# Patient Record
Sex: Male | Born: 1955 | Race: Black or African American | Hispanic: No | Smoking: Current some day smoker
Health system: Southern US, Community
[De-identification: ages and names within clinical notes are randomized; demographics above are authoritative.]

## PROBLEM LIST (undated history)

## (undated) DIAGNOSIS — I10 Essential (primary) hypertension: Secondary | ICD-10-CM

## (undated) DIAGNOSIS — F32A Depression, unspecified: Secondary | ICD-10-CM

## (undated) DIAGNOSIS — J449 Chronic obstructive pulmonary disease, unspecified: Secondary | ICD-10-CM

## (undated) DIAGNOSIS — C801 Malignant (primary) neoplasm, unspecified: Secondary | ICD-10-CM

## (undated) DIAGNOSIS — F419 Anxiety disorder, unspecified: Secondary | ICD-10-CM

## (undated) DIAGNOSIS — F0390 Unspecified dementia without behavioral disturbance: Secondary | ICD-10-CM

## (undated) DIAGNOSIS — E119 Type 2 diabetes mellitus without complications: Secondary | ICD-10-CM

## (undated) DIAGNOSIS — F329 Major depressive disorder, single episode, unspecified: Secondary | ICD-10-CM

## (undated) DIAGNOSIS — M1990 Unspecified osteoarthritis, unspecified site: Secondary | ICD-10-CM

## (undated) HISTORY — DX: Type 2 diabetes mellitus without complications: E11.9

## (undated) HISTORY — DX: Malignant (primary) neoplasm, unspecified: C80.1

## (undated) HISTORY — DX: Unspecified dementia, unspecified severity, without behavioral disturbance, psychotic disturbance, mood disturbance, and anxiety: F03.90

## (undated) HISTORY — DX: Anxiety disorder, unspecified: F41.9

---

## 2012-05-31 LAB — CBC WITH AUTOMATED DIFF
ABS. BASOPHILS: 0.1 10*3/uL (ref 0.0–0.1)
ABS. EOSINOPHILS: 0.3 10*3/uL (ref 0.0–0.4)
ABS. LYMPHOCYTES: 3.1 10*3/uL (ref 0.8–3.5)
ABS. MONOCYTES: 0.6 10*3/uL (ref 0.0–1.0)
ABS. NEUTROPHILS: 1.6 10*3/uL — ABNORMAL LOW (ref 1.8–8.0)
BASOPHILS: 1 % (ref 0–1)
EOSINOPHILS: 5 % (ref 0–7)
HCT: 43.3 % (ref 36.6–50.3)
HGB: 15.5 g/dL (ref 12.1–17.0)
LYMPHOCYTES: 55 % — ABNORMAL HIGH (ref 12–49)
MCH: 31 PG (ref 26.0–34.0)
MCHC: 35.8 g/dL (ref 30.0–36.5)
MCV: 86.6 FL (ref 80.0–99.0)
MONOCYTES: 11 % (ref 5–13)
NEUTROPHILS: 28 % — ABNORMAL LOW (ref 32–75)
PLATELET: 178 10*3/uL (ref 150–400)
RBC: 5 M/uL (ref 4.10–5.70)
RDW: 14.8 % — ABNORMAL HIGH (ref 11.5–14.5)
WBC: 5.7 10*3/uL (ref 4.1–11.1)

## 2012-05-31 LAB — CK W/ CKMB & INDEX
CK - MB: 1.6 NG/ML (ref 0.5–3.6)
CK-MB Index: 1 (ref 0–2.5)
CK: 168 U/L (ref 39–308)

## 2012-05-31 LAB — METABOLIC PANEL, COMPREHENSIVE
A-G Ratio: 0.8 — ABNORMAL LOW (ref 1.1–2.2)
ALT (SGPT): 109 U/L — ABNORMAL HIGH (ref 12–78)
AST (SGOT): 104 U/L — ABNORMAL HIGH (ref 15–37)
Albumin: 3.6 g/dL (ref 3.5–5.0)
Alk. phosphatase: 115 U/L (ref 50–136)
Anion gap: 8 mmol/L (ref 5–15)
BUN/Creatinine ratio: 16 (ref 12–20)
BUN: 9 MG/DL (ref 6–20)
Bilirubin, total: 0.6 MG/DL (ref 0.2–1.0)
CO2: 27 MMOL/L (ref 21–32)
Calcium: 8.8 MG/DL (ref 8.5–10.1)
Chloride: 104 MMOL/L (ref 97–108)
Creatinine: 0.56 MG/DL (ref 0.45–1.15)
GFR est AA: >60 mL/min/{1.73_m2} (ref >60)
GFR est non-AA: >60 mL/min/{1.73_m2} (ref >60)
Globulin: 4.3 g/dL — ABNORMAL HIGH (ref 2.0–4.0)
Glucose: 144 MG/DL — ABNORMAL HIGH (ref 65–100)
Potassium: 3.8 MMOL/L (ref 3.5–5.1)
Protein, total: 7.9 g/dL (ref 6.4–8.2)
Sodium: 139 MMOL/L (ref 136–145)

## 2012-05-31 LAB — TROPONIN I: Troponin-I, Qt.: 0.04 ng/mL (ref <0.05)

## 2012-05-31 MED ORDER — SODIUM CHLORIDE 0.9 % IJ SYRG
Freq: Three times a day (TID) | INTRAMUSCULAR | Status: DC
Start: 2012-05-31 — End: 2012-05-31
  Administered 2012-05-31: 12:00:00 via INTRAVENOUS

## 2012-05-31 MED ORDER — ONDANSETRON (PF) 4 MG/2 ML INJECTION
4 mg/2 mL | INTRAMUSCULAR | Status: DC
Start: 2012-05-31 — End: 2012-05-31

## 2012-05-31 MED ORDER — SODIUM CHLORIDE 0.9 % IJ SYRG
INTRAMUSCULAR | Status: DC | PRN
Start: 2012-05-31 — End: 2012-05-31

## 2012-05-31 MED ADMIN — sodium chloride 0.9 % bolus infusion 1,000 mL: INTRAVENOUS | @ 12:00:00 | NDC 00409798348

## 2012-05-31 MED FILL — SODIUM CHLORIDE 0.9 % IV: INTRAVENOUS | Qty: 1000

## 2012-05-31 NOTE — ED Notes (Signed)
Pt has been given a urinal and was asked for a sample but is unable to void at this time.

## 2012-05-31 NOTE — ED Notes (Signed)
Pt tolerating po fluids.

## 2012-05-31 NOTE — ED Provider Notes (Signed)
HPI Comments: Zachary Gallagher 56 y.o. presents ambulatory to ED with CC of acute onset of 10/10 CP and dizziness followed by near syncopal epsiode x this morning while pt was sitting in chair in mother-in-law's room in ED where she is currently a pt. Family denies fall or head trauma and indicate they were able to catch him. Pt reports getting up early and leaving to take mother-in-law to hospital this morning without eating breakfast. Pt reports associated sxs of nausea, vomiting, and generalized weakness. Pt notes hx significant of syncope x2 but denies hx of admission for syncope.   Pt denies SOB or palpitations.    PCP: Windell Norfolk, MD    PMHx: Significant for HTN, COPD, depression, anxiety  PSx: - tobacco, + EtOH, + drug use (marijuana)     There are no other complaints, changes or physical findings at this time. 8:17 AM   Written by Sidney Ace, ED Scribe, as dictated by Barnie Del, MD.      The history is provided by the patient and a relative.        Past Medical History   Diagnosis Date   ??? Hypertension    ??? Psychiatric disorder      depression, anxiety        No past surgical history on file.      No family history on file.     History     Social History   ??? Marital Status: N/A     Spouse Name: N/A     Number of Children: N/A   ??? Years of Education: N/A     Occupational History   ??? Not on file.     Social History Main Topics   ??? Smoking status: Not on file   ??? Smokeless tobacco: Not on file   ??? Alcohol Use: Yes   ??? Drug Use: Yes     Special: Marijuana   ??? Sexually Active:      Other Topics Concern   ??? Not on file     Social History Narrative   ??? No narrative on file                  ALLERGIES: Review of patient's allergies indicates no known allergies.      Review of Systems   HENT: Negative.         -head trauma   Eyes: Negative.    Respiratory: Negative.  Negative for shortness of breath.    Cardiovascular: Positive for chest pain. Negative for palpitations.   Gastrointestinal:  Positive for nausea and vomiting.   Genitourinary: Negative.    Musculoskeletal: Negative.    Skin: Negative.    Neurological: Positive for dizziness and weakness (generalized).        +near syncopal episode   Hematological: Negative.    Psychiatric/Behavioral: Negative.    All other systems reviewed and are negative.        Filed Vitals:    05/31/12 0806 05/31/12 0807 05/31/12 0809 05/31/12 0900   BP: 110/78 113/75 108/81 114/83   Pulse: 68 75 90 65   Temp:       Resp: 14 17 17 14    SpO2: 97% 97% 98% 99%            Physical Exam   Nursing note and vitals reviewed.  Constitutional: He is oriented to person, place, and time. He appears well-developed and well-nourished. No distress.   HENT:   Head: Normocephalic  and atraumatic.   Eyes: Conjunctivae and EOM are normal. Pupils are equal, round, and reactive to light.   Neck: Normal range of motion. Neck supple. No JVD present.   Cardiovascular: Normal rate, regular rhythm, normal heart sounds and intact distal pulses.  Exam reveals no friction rub.    No murmur heard.  Pulmonary/Chest: Effort normal and breath sounds normal. No respiratory distress. He has no wheezes. He has no rales.   Abdominal: Soft. Bowel sounds are normal. There is no tenderness.   Musculoskeletal: Normal range of motion. He exhibits no edema and no tenderness.   Neurological: He is alert and oriented to person, place, and time. He has normal strength. No cranial nerve deficit or sensory deficit. He exhibits normal muscle tone.   Skin: Skin is warm and dry. No rash noted.   Psychiatric: He has a normal mood and affect. His behavior is normal.        MDM     Differential Diagnosis; Clinical Impression; Plan:     Vasovagal syncope, metabolic abnormality, arrhythmia      Vasovagal episode in ER with mother, neg CT and labs exept LFTs, + ETOH drinker, will refer to PCP, offered admission for further monitoring/eval, pt refused, will refer to cardiolgy  Amount and/or Complexity of Data Reviewed:    Clinical lab tests:  Ordered and reviewed  Tests in the radiology section of CPT??:  Ordered and reviewed  Tests in the medicine section of the CPT??:  Ordered and reviewed   Obtain history from someone other than the patient:  Yes   Review and summarize past medical records:  Yes   Discuss the patient with another provider:  No   Independant visualization of image, tracing, or specimen:  Yes  Progress:   Patient progress:  Improved and stable      Procedures    9:54 AM  Pt reevaluated. Pt indicates he is feeling much better and denies any sxs at this time. Pt offered admission and declined.   Written by Sidney Ace, ED Scribe, as dictated by Barnie Del, MD.     10:06 AM  Pt offered admission a second time and refused a second time. Pt will sign admission refusal. Pt denies any CP at this time.  Written by Sidney Ace, ED Scribe, as dictated by Barnie Del, MD.    10:07 AM  The patient's results have been reviewed with them.  Patient and/or family, verbally conveyed their understanding and agreement of the patient's signs, symptoms, diagnosis, treatment and prognosis and additionally agree to follow up as recommended with PCP and Dr. Namon Cirri or return to the Emergency Room should their condition change prior to their follow-up appointment. The patient verbally agrees with the care-plan and verbally conveys that all of their questions have been answered.   Written by Sidney Ace, ED Scribe, as dictated by Barnie Del, MD.      10:08 AM    I have reviewed all pertinent and currently available diagnostic test results for this visit including, but not limited to, labs, xrays, and EKGs. I have reviewed all pertinent and currently available medical records. My plan of care and further evaluation and/or disposition is based on these results, as well as the initial, and subsequent, history and physical exam, as well as any additional complaints during the visit.    Toney Rakes, MD     EKG  interpretation: (Preliminary)  Rhythm: normal sinus rhythm; and regular .  Rate (approx.): 81; Blocks: none; Ectopy: none; Axis: normal; P wave: normal; QRS interval: normal ; ST/T wave: non-specific changes; in  Lead: ; Other findings: .  This EKG was interpreted by Barnie Del, MD ,ED Provider.      Lab Tests:  Recent Results (from the past 12 hour(s))   CBC WITH AUTOMATED DIFF    Collection Time    05/31/12  8:08 AM       Component Value Range    WBC 5.7  4.1 - 11.1 K/uL    RBC 5.00  4.10 - 5.70 Audrena Talaga/uL    HGB 15.5  12.1 - 17.0 g/dL    HCT 16.1  09.6 - 04.5 %    MCV 86.6  80.0 - 99.0 FL    MCH 31.0  26.0 - 34.0 PG    MCHC 35.8  30.0 - 36.5 g/dL    RDW 40.9 (*) 81.1 - 14.5 %    PLATELET 178  150 - 400 K/uL    NEUTROPHILS 28 (*) 32 - 75 %    LYMPHOCYTES 55 (*) 12 - 49 %    MONOCYTES 11  5 - 13 %    EOSINOPHILS 5  0 - 7 %    BASOPHILS 1  0 - 1 %    ABS. NEUTROPHILS 1.6 (*) 1.8 - 8.0 K/UL    ABS. LYMPHOCYTES 3.1  0.8 - 3.5 K/UL    ABS. MONOCYTES 0.6  0.0 - 1.0 K/UL    ABS. EOSINOPHILS 0.3  0.0 - 0.4 K/UL    ABS. BASOPHILS 0.1  0.0 - 0.1 K/UL   CK W/ CKMB & INDEX    Collection Time    05/31/12  8:08 AM       Component Value Range    CK - MB 1.6  0.5 - 3.6 NG/ML    CK-MB Index 1.0  0 - 2.5      CK 168  39 - 308 U/L   METABOLIC PANEL, COMPREHENSIVE    Collection Time    05/31/12  8:08 AM       Component Value Range    Sodium 139  136 - 145 MMOL/L    Potassium 3.8  3.5 - 5.1 MMOL/L    Chloride 104  97 - 108 MMOL/L    CO2 27  21 - 32 MMOL/L    Anion gap 8  5 - 15 mmol/L    Glucose 144 (*) 65 - 100 MG/DL    BUN 9  6 - 20 MG/DL    Creatinine 9.14  7.82 - 1.15 MG/DL    BUN/Creatinine ratio 16  12 - 20      GFR est-AA >60  >60 ml/min/1.81m2    GFR est non-AA >60  >60 ml/min/1.43m2    Calcium 8.8  8.5 - 10.1 MG/DL    Bilirubin, total 0.6  0.2 - 1.0 MG/DL    ALT 956 (*) 12 - 78 U/L    AST 104 (*) 15 - 37 U/L    Alk. phosphatase 115  50 - 136 U/L    Protein, total 7.9  6.4 - 8.2 g/dL    Albumin 3.6  3.5 - 5.0 g/dL    Globulin 4.3  (*) 2.0 - 4.0 g/dL    A-G Ratio 0.8 (*) 1.1 - 2.2     TROPONIN I    Collection Time    05/31/12  8:08 AM       Component Value Range  Troponin-I, Qt. 0.04  <0.05 ng/mL       Imaging Results:  **Final Report**      ICD Codes / Adm.Diagnosis: 100002 161096 / Dizziness Chest Pain  Examination: CR CHEST PA AND LATERAL - [id number] - May 31 2012 8:24AM  Accession No: [id number]  Reason: Pain      REPORT:  INDICATION: Pain    COMPARISON: None    FINDINGS:    Frontal and lateral views of the chest demonstrate a normal   cardiomediastinal silhouette. The lungs are adequately expanded. There is   no edema, effusion, consolidation, or pneumothorax. The osseous structures   are unremarkable.      IMPRESSION:  No acute process.          Signing/Reading Doctor: Artelia Laroche LEWIS [phone])   Approved: Artelia Laroche LEWIS [phone]) May 31 2012 8:25AM       **Final Report**      ICD Codes / Adm.Diagnosis: 100002 469629 / Dizziness Chest Pain  Examination: CT HEAD WO CON - [id number] - May 31 2012 8:17AM  Accession No: [id number]  Reason: Pain      REPORT:  INDICATION: Pain    EXAM: HEAD CT WITHOUT CONTRAST    COMPARISON: None    TECHNIQUE: Routine noncontrast axial head CT was performed.    FINDINGS:    The ventricles are midline without hydrocephalus. There is no acute intra   or extra-axial hemorrhage. There is no acute infarct. There is no mass on   this unenhanced examination. There is no significant white matter disease.   The basal cisterns are patent. The paranasal sinuses are clear.      IMPRESSION:    No acute process.              Signing/Reading Doctor: Artelia Laroche LEWIS [phone])   Approved: Artelia Laroche LEWIS [phone]) May 31 2012 8:24AM     Medications Given:    Medications   sodium chloride (NS) flush 5-10 mL (10 mL IntraVENous Given 05/31/12 0759)   sodium chloride (NS) flush 5-10 mL (not administered)   ondansetron (ZOFRAN) injection 4 mg (0 mg IntraVENous Held 05/31/12 0830)   sodium chloride 0.9 % bolus infusion 1,000 mL (1000 mL IntraVENous  New Bag 05/31/12 0759)       Impression:   1. Vasovagal syncope    2. Elevated LFTs    3. Chest pain        Plan:  1. F/U with PCP  2. F/U with Dr. Namon Cirri  Return to ED if worse.

## 2012-06-01 LAB — EKG, 12 LEAD, INITIAL
Atrial Rate: 81 {beats}/min
Calculated P Axis: 66 degrees
Calculated R Axis: -4 degrees
Calculated T Axis: 47 degrees
Diagnosis: NORMAL
P-R Interval: 132 ms
Q-T Interval: 376 ms
QRS Duration: 68 ms
QTC Calculation (Bezet): 436 ms
Ventricular Rate: 81 {beats}/min

## 2013-12-29 ENCOUNTER — Encounter

## 2015-01-19 ENCOUNTER — Inpatient Hospital Stay
Admit: 2015-01-19 | Discharge: 2015-01-20 | Disposition: A | Discharge status: Home / Self Care | Payer: Self-pay | Attending: Emergency Medicine

## 2015-01-19 DIAGNOSIS — T50902A Poisoning by unspecified drugs, medicaments and biological substances, intentional self-harm, initial encounter: Secondary | ICD-10-CM

## 2015-01-19 LAB — VENOUS BLOOD GAS
VENOUS BASE EXCESS: 3.3 mmol/L
VENOUS BICARBONATE: 27 mmol/L (ref 23–28)
VENOUS O2 SATURATION: 92 % — ABNORMAL HIGH (ref 65–88)
VENOUS PCO2: 39 mmHg — ABNORMAL LOW (ref 41–51)
VENOUS PH: 7.46 — ABNORMAL HIGH (ref 7.32–7.42)
VENOUS PO2: 60 mmHg — ABNORMAL HIGH (ref 25–40)

## 2015-01-19 LAB — METABOLIC PANEL, COMPREHENSIVE
A-G Ratio: 1 — ABNORMAL LOW (ref 1.1–2.2)
A-G Ratio: 0.9 — ABNORMAL LOW (ref 1.1–2.2)
ALT (SGPT): 86 U/L — ABNORMAL HIGH (ref 12–78)
ALT (SGPT): 82 U/L — ABNORMAL HIGH (ref 12–78)
AST (SGOT): 60 U/L — ABNORMAL HIGH (ref 15–37)
AST (SGOT): 57 U/L — ABNORMAL HIGH (ref 15–37)
Albumin: 3.7 g/dL (ref 3.5–5.0)
Albumin: 3.4 g/dL — ABNORMAL LOW (ref 3.5–5.0)
Alk. phosphatase: 87 U/L (ref 45–117)
Alk. phosphatase: 89 U/L (ref 45–117)
Anion gap: 11 mmol/L (ref 5–15)
Anion gap: 7 mmol/L (ref 5–15)
BUN/Creatinine ratio: 14 (ref 12–20)
BUN/Creatinine ratio: 14 (ref 12–20)
BUN: 15 MG/DL (ref 6–20)
BUN: 15 MG/DL (ref 6–20)
Bilirubin, total: 0.7 MG/DL (ref 0.2–1.0)
Bilirubin, total: 0.5 MG/DL (ref 0.2–1.0)
CO2: 25 mmol/L (ref 21–32)
CO2: 29 mmol/L (ref 21–32)
Calcium: 8.8 MG/DL (ref 8.5–10.1)
Calcium: 8.6 MG/DL (ref 8.5–10.1)
Chloride: 105 mmol/L (ref 97–108)
Chloride: 107 mmol/L (ref 97–108)
Creatinine: 1.09 MG/DL (ref 0.70–1.30)
Creatinine: 1.08 MG/DL (ref 0.70–1.30)
GFR est AA: >60 mL/min/{1.73_m2} (ref >60)
GFR est AA: >60 mL/min/{1.73_m2} (ref >60)
GFR est non-AA: >60 mL/min/{1.73_m2} (ref >60)
GFR est non-AA: >60 mL/min/{1.73_m2} (ref >60)
Globulin: 3.8 g/dL (ref 2.0–4.0)
Globulin: 3.9 g/dL (ref 2.0–4.0)
Glucose: 175 mg/dL — ABNORMAL HIGH (ref 65–100)
Glucose: 124 mg/dL — ABNORMAL HIGH (ref 65–100)
Potassium: 2.9 mmol/L — ABNORMAL LOW (ref 3.5–5.1)
Potassium: 3.7 mmol/L (ref 3.5–5.1)
Protein, total: 7.5 g/dL (ref 6.4–8.2)
Protein, total: 7.3 g/dL (ref 6.4–8.2)
Sodium: 141 mmol/L (ref 136–145)
Sodium: 143 mmol/L (ref 136–145)

## 2015-01-19 LAB — CBC WITH AUTOMATED DIFF
ABS. BASOPHILS: 0 10*3/uL (ref 0.0–0.1)
ABS. EOSINOPHILS: 0.2 10*3/uL (ref 0.0–0.4)
ABS. LYMPHOCYTES: 2.2 10*3/uL (ref 0.8–3.5)
ABS. MONOCYTES: 0.4 10*3/uL (ref 0.0–1.0)
ABS. NEUTROPHILS: 1.8 10*3/uL (ref 1.8–8.0)
BASOPHILS: 1 % (ref 0–1)
EOSINOPHILS: 4 % (ref 0–7)
HCT: 42.5 % (ref 36.6–50.3)
HGB: 14.6 g/dL (ref 12.1–17.0)
LYMPHOCYTES: 47 % (ref 12–49)
MCH: 29.3 PG (ref 26.0–34.0)
MCHC: 34.4 g/dL (ref 30.0–36.5)
MCV: 85.2 FL (ref 80.0–99.0)
MONOCYTES: 9 % (ref 5–13)
NEUTROPHILS: 39 % (ref 32–75)
PLATELET: 198 10*3/uL (ref 150–400)
RBC: 4.99 M/uL (ref 4.10–5.70)
RDW: 14.5 % (ref 11.5–14.5)
WBC: 4.6 10*3/uL (ref 4.1–11.1)

## 2015-01-19 LAB — GLUCOSE, POC: Glucose (POC): 151 mg/dL — ABNORMAL HIGH (ref 65–100)

## 2015-01-19 LAB — SALICYLATE
Salicylate level: <1.7 MG/DL — ABNORMAL LOW (ref 2.8–20.0)
Salicylate level: <1.7 MG/DL — ABNORMAL LOW (ref 2.8–20.0)

## 2015-01-19 LAB — ACETAMINOPHEN
Acetaminophen level: <2 ug/mL — ABNORMAL LOW (ref 10–30)
Acetaminophen level: <2 ug/mL — ABNORMAL LOW (ref 10–30)

## 2015-01-19 LAB — MAGNESIUM: Magnesium: 2.1 mg/dL (ref 1.6–2.4)

## 2015-01-19 LAB — ETHYL ALCOHOL: ALCOHOL(ETHYL),SERUM: <10 MG/DL (ref <10)

## 2015-01-19 LAB — LIPASE: Lipase: 115 U/L (ref 73–393)

## 2015-01-19 MED ORDER — POTASSIUM CHLORIDE 10 % ORAL LIQUID
20 mEq/15 mL | ORAL | Status: AC
Start: 2015-01-19 — End: 2015-01-19
  Administered 2015-01-19: 20:00:00 via ORAL

## 2015-01-19 MED ORDER — SODIUM CHLORIDE 0.9 % IJ SYRG
Freq: Three times a day (TID) | INTRAMUSCULAR | Status: DC
Start: 2015-01-19 — End: 2015-01-20
  Administered 2015-01-20 (×2): via INTRAVENOUS

## 2015-01-19 MED ORDER — ENOXAPARIN 40 MG/0.4 ML SUB-Q SYRINGE
40 mg/0.4 mL | SUBCUTANEOUS | Status: DC
Start: 2015-01-19 — End: 2015-01-20
  Administered 2015-01-20: via SUBCUTANEOUS

## 2015-01-19 MED ORDER — POTASSIUM CHLORIDE SR 10 MEQ TAB
10 mEq | ORAL | Status: AC
Start: 2015-01-19 — End: 2015-01-19
  Administered 2015-01-19 (×2): via ORAL

## 2015-01-19 MED ORDER — ZOLPIDEM 5 MG TAB
5 mg | Freq: Every evening | ORAL | Status: DC | PRN
Start: 2015-01-19 — End: 2015-01-20

## 2015-01-19 MED ORDER — SODIUM CHLORIDE 0.9 % IJ SYRG
INTRAMUSCULAR | Status: DC | PRN
Start: 2015-01-19 — End: 2015-01-20

## 2015-01-19 MED FILL — ZOLPIDEM 5 MG TAB: 5 mg | ORAL | Qty: 1

## 2015-01-19 MED FILL — POTASSIUM CHLORIDE SR 10 MEQ TAB: 10 mEq | ORAL | Qty: 4

## 2015-01-19 MED FILL — POTASSIUM CHLORIDE 10 % ORAL LIQUID: 20 mEq/15 mL | ORAL | Qty: 30

## 2015-01-19 NOTE — ED Notes (Signed)
Assumed care of pt at this time from Rebecca G RN.
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
## 2015-01-19 NOTE — ED Notes (Signed)
Report given to Sarah RN to assume pt care in room 210.

## 2015-01-19 NOTE — ED Notes (Signed)
Rails padded for seizure precautions

## 2015-01-19 NOTE — ED Notes (Signed)
Attempted to call report. Per supervisor, floor unable to take report at this time.

## 2015-01-19 NOTE — ED Notes (Signed)
Pt resting in bed with eyes closed

## 2015-01-19 NOTE — ED Notes (Signed)
Pt continues to rest with eyes closed. Will continue to monitor.

## 2015-01-19 NOTE — ED Notes (Signed)
Dr. Bhola at bedside to evaluate pt

## 2015-01-19 NOTE — ED Notes (Signed)
Pt reports attempted suicide this morning due to multiple stressors, unsure of what medications he took, reports putting medications on his bed and taking 2 handfuls, per EMS the medications found on pt's bed were as follows: Lyrica, Amlodipine, Tramadol, Lisinopril, Metformin, Viagra, Cymbalta, Ambien, Flexeril, Dclofenac, HCTZ, denies any HI; denies auditory or visual hallucinations

## 2015-01-19 NOTE — Progress Notes (Signed)
Mr. Zachary Gallagher is a 59yo AAM who is being admitted for intentional overdose. Pt is self pay, PCP is Dr. Rubye Oaksickerson. CM will continue to follow.    Zachary Gallagher, MSW  [phone]
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
## 2015-01-19 NOTE — ED Notes (Signed)
Pt resting on stretcher with eye closed. Pt easily arousable to name. Voices no needs or concerns at this time other than something to drink. Awaiting bed assignment from supervisor. Will continue to monitor.

## 2015-01-19 NOTE — ED Notes (Signed)
Pt becoming more drowsy, agitated when awoken, becoming irritated with PCT

## 2015-01-19 NOTE — ED Notes (Signed)
Per Dr. Maretta BeesBhola, Lovenox to be administered on medical floor

## 2015-01-19 NOTE — Progress Notes (Addendum)
2110- Patient arrived to room 210 from ED, placed in position of comfort. Tele in place. Oriented patient to room/unit. Sitter at bedside for suicidal precautions. CB in reach.     2120- Patient complain that he is having difficulty breathing, states he typically takes neb tx at home. No ss resp distress noted. Patient also reports 10/10 generalized pain. Notified Dr. Rubye Beachabney (tele hospitalist); order received for prn neb tx and ibuporfen. RT notified patient request neb tx at this time.     1150- Unable to complete admission database. Med rec incomplete; patient is unaware of what medications he typically takes at home.

## 2015-01-19 NOTE — ED Notes (Signed)
Emergency Department Nursing Plan of Care       The Nursing Plan of Care is developed from the Nursing assessment and Emergency Department Attending provider initial evaluation.  The plan of care may be reviewed in the ???ED Provider note???.    The Plan of Care was developed with the following considerations:   Patient / Family readiness to learn indicated ZO:[id number]:verbalized understanding  Persons(s) to be included in education: patient  Barriers to Learning/Limitations:No    Signed     Deretha EmoryREBECCA T GREGG, RN    01/19/2015   3:03 PM

## 2015-01-19 NOTE — ED Notes (Signed)
Spoke with poison control to give previous LFT values, no further recommendations at this

## 2015-01-19 NOTE — ED Provider Notes (Addendum)
Patient is a 59 y.o. male presenting with Ingested Medication. The history is provided by the patient and the EMS personnel. No language interpreter was used.   Drug Overdose  This is a new problem. The current episode started 3 to 5 hours ago. The problem has not changed since onset.Associated symptoms include shortness of breath. Pertinent negatives include no chest pain and no abdominal pain.      Pt presents via EMS with multidrug overdose. Unknown amounts of multiple medications, see list. He admits to suicidal intent but expresses regret. He has a history of depression/anxiety, but is usually seen at [facility].     Medication list found at scene: lyrica, amlodipine, tramadol, lisinopril, metformin, viagra, cymbalta, ambien, flexeril, diclofenac, hctz.    [facility], who recommended addition of pH via VBG, and observation for serotonin effects from cymbalta for 8 hours.      Past Medical History:   Diagnosis Date   ??? Hypertension    ??? Psychiatric disorder      depression, anxiety   ??? Chronic obstructive pulmonary disease (HCC)    ??? Ill-defined condition      chronic back pain       History reviewed. No pertinent past surgical history.      History reviewed. No pertinent family history.    History     Social History   ??? Marital Status: DIVORCED     Spouse Name: N/A     Number of Children: N/A   ??? Years of Education: N/A     Occupational History   ??? Not on file.     Social History Main Topics   ??? Smoking status: Never Smoker    ??? Smokeless tobacco: Not on file   ??? Alcohol Use: Yes      Comment: "every now and then"   ??? Drug Use: Yes     Special: Marijuana, Cocaine      Comment: tried cocaine last night   ??? Sexual Activity: Not on file     Other Topics Concern   ??? Not on file     Social History Narrative           ALLERGIES: Codeine      Review of Systems   Respiratory: Positive for shortness of breath.    Cardiovascular: Negative for chest pain.    Gastrointestinal: Negative for abdominal pain.   Psychiatric/Behavioral: Positive for suicidal ideas and dysphoric mood. The patient is nervous/anxious.    All other systems reviewed and are negative.      Filed Vitals:    01/19/15 1324 01/19/15 1327   BP: 165/102 165/107   Pulse: 89    Temp: 98.5 ??F (36.9 ??C)    Resp: 14    Height: 5\' 11"  (1.803 m)    Weight: 75.297 kg (166 lb)    SpO2: 95%             Physical Exam   Constitutional: He is oriented to person, place, and time. He appears well-developed and well-nourished. No distress.   Alert tearful cooperative Black male   HENT:   Head: Normocephalic and atraumatic.   Nose: Nose normal.   Mouth/Throat: Oropharynx is clear and moist. No oropharyngeal exudate.   Eyes: Conjunctivae and EOM are normal. Pupils are equal, round, and reactive to light. Right eye exhibits no discharge. Left eye exhibits no discharge. No scleral icterus.   Neck: Normal range of motion. Neck supple.   Cardiovascular: Normal rate, regular  rhythm and normal heart sounds.  Exam reveals no gallop and no friction rub.    No murmur heard.  Pulmonary/Chest: Effort normal and breath sounds normal. No respiratory distress. He has no wheezes. He has no rales.   Abdominal: Soft. Bowel sounds are normal. He exhibits no distension. There is no tenderness.   Musculoskeletal: Normal range of motion. He exhibits no edema or tenderness.   Neurological: He is alert and oriented to person, place, and time. No cranial nerve deficit. Coordination normal.   Skin: Skin is warm and dry. No rash noted. He is not diaphoretic. No erythema. No pallor.   Psychiatric: His behavior is normal. Judgment and thought content normal.   Nursing note and vitals reviewed.       MDM    Procedures          3:27 PM    I spoke with Dr. Maretta Bees, Consult for Hospitalist. Discussed available diagnostic tests and clinical findings. He is in agreement with care plans as outlined. He will admit pt.   Samuel Bouche B. Lenox Ponds, MD

## 2015-01-19 NOTE — ED Notes (Signed)
Physician contacted poison control

## 2015-01-19 NOTE — ED Notes (Signed)
Pt restless, fidgeting in bed, reports muscle cramps and shortness of breath, breathing not labored and lung sounds clear bilaterally, reports feeling anxious, Dr. Maretta BeesBhola aware, does not want to give anything for anxiety at this time

## 2015-01-19 NOTE — ED Notes (Signed)
Bedside and Verbal shift change report given to Adrian PrinceKita Copeland, RN (oncoming nurse) by Marisa Huaebecca Gregg, RN (offgoing nurse). Report included the following information SBAR, ED Summary, Procedure Summary, MAR, Recent Results and Cardiac Rhythm NSR.

## 2015-01-19 NOTE — ED Notes (Signed)
[facility] and updated on vitals and abnormal laboratory results.

## 2015-01-19 NOTE — ED Notes (Signed)
Pt reports alert and oriented, reports some shortness of breath, breathing not labored at this time, lung sounds clear bilaterally, pt also reports chronic lower back pain and bilateral knee pain; pt states that he no longer wants to harm himself

## 2015-01-20 LAB — GLUCOSE, POC
Glucose (POC): 145 mg/dL — ABNORMAL HIGH (ref 65–100)
Glucose (POC): 115 mg/dL — ABNORMAL HIGH (ref 65–100)

## 2015-01-20 LAB — DRUG SCREEN, URINE
AMPHETAMINES: NEGATIVE
BARBITURATES: NEGATIVE
BENZODIAZEPINES: NEGATIVE
COCAINE: POSITIVE — AB
METHADONE: NEGATIVE
OPIATES: NEGATIVE
PCP(PHENCYCLIDINE): NEGATIVE
THC (TH-CANNABINOL): POSITIVE — AB

## 2015-01-20 LAB — METABOLIC PANEL, BASIC
Anion gap: 7 mmol/L (ref 5–15)
BUN/Creatinine ratio: 14 (ref 12–20)
BUN: 13 MG/DL (ref 6–20)
CO2: 25 mmol/L (ref 21–32)
Calcium: 8.1 MG/DL — ABNORMAL LOW (ref 8.5–10.1)
Chloride: 109 mmol/L — ABNORMAL HIGH (ref 97–108)
Creatinine: 0.93 MG/DL (ref 0.70–1.30)
GFR est AA: >60 mL/min/{1.73_m2} (ref >60)
GFR est non-AA: >60 mL/min/{1.73_m2} (ref >60)
Glucose: 125 mg/dL — ABNORMAL HIGH (ref 65–100)
Potassium: 3.4 mmol/L — ABNORMAL LOW (ref 3.5–5.1)
Sodium: 141 mmol/L (ref 136–145)

## 2015-01-20 LAB — HEPATIC FUNCTION PANEL
A-G Ratio: 0.9 — ABNORMAL LOW (ref 1.1–2.2)
ALT (SGPT): 76 U/L (ref 12–78)
AST (SGOT): 53 U/L — ABNORMAL HIGH (ref 15–37)
Albumin: 3.2 g/dL — ABNORMAL LOW (ref 3.5–5.0)
Alk. phosphatase: 79 U/L (ref 45–117)
Bilirubin, direct: 0.2 MG/DL (ref 0.0–0.2)
Bilirubin, total: 0.4 MG/DL (ref 0.2–1.0)
Globulin: 3.4 g/dL (ref 2.0–4.0)
Protein, total: 6.6 g/dL (ref 6.4–8.2)

## 2015-01-20 LAB — EKG, 12 LEAD, INITIAL
Atrial Rate: 82 {beats}/min
Calculated P Axis: 65 degrees
Calculated R Axis: -10 degrees
Calculated T Axis: 15 degrees
Diagnosis: NORMAL
P-R Interval: 146 ms
Q-T Interval: 364 ms
QRS Duration: 76 ms
QTC Calculation (Bezet): 425 ms
Ventricular Rate: 82 {beats}/min

## 2015-01-20 LAB — CBC W/O DIFF
HCT: 41.5 % (ref 36.6–50.3)
HGB: 14.5 g/dL (ref 12.1–17.0)
MCH: 30.3 PG (ref 26.0–34.0)
MCHC: 34.9 g/dL (ref 30.0–36.5)
MCV: 86.6 FL (ref 80.0–99.0)
PLATELET: 182 10*3/uL (ref 150–400)
RBC: 4.79 M/uL (ref 4.10–5.70)
RDW: 14.2 % (ref 11.5–14.5)
WBC: 4.2 10*3/uL (ref 4.1–11.1)

## 2015-01-20 MED ORDER — IBUPROFEN 100 MG/5 ML ORAL SUSP
100 mg/5 mL | Freq: Four times a day (QID) | ORAL | Status: DC | PRN
Start: 2015-01-20 — End: 2015-01-20
  Administered 2015-01-20: 04:00:00 via ORAL

## 2015-01-20 MED ORDER — ALBUTEROL SULFATE 0.083 % (0.83 MG/ML) SOLN FOR INHALATION
2.5 mg /3 mL (0.083 %) | Freq: Four times a day (QID) | RESPIRATORY_TRACT | Status: DC | PRN
Start: 2015-01-20 — End: 2015-01-20
  Administered 2015-01-20: 17:00:00 via RESPIRATORY_TRACT

## 2015-01-20 MED ORDER — POTASSIUM CHLORIDE SR 10 MEQ TAB
10 mEq | Freq: Once | ORAL | Status: DC
Start: 2015-01-20 — End: 2015-01-20

## 2015-01-20 MED ORDER — IBUPROFEN 200 MG TAB
200 mg | Freq: Four times a day (QID) | ORAL | Status: DC | PRN
Start: 2015-01-20 — End: 2015-01-20

## 2015-01-20 MED ORDER — CITALOPRAM 20 MG TAB
20 mg | Freq: Every day | ORAL | Status: DC
Start: 2015-01-20 — End: 2015-01-20

## 2015-01-20 MED FILL — CHILDREN'S IBUPROFEN 100 MG/5 ML ORAL SUSPENSION: 100 mg/5 mL | ORAL | Qty: 10

## 2015-01-20 MED FILL — IBUPROFEN 200 MG TAB: 200 mg | ORAL | Qty: 1

## 2015-01-20 MED FILL — ALBUTEROL SULFATE 0.083 % (0.83 MG/ML) SOLN FOR INHALATION: 2.5 mg /3 mL (0.083 %) | RESPIRATORY_TRACT | Qty: 1

## 2015-01-20 MED FILL — ENOXAPARIN 40 MG/0.4 ML SUB-Q SYRINGE: 40 mg/0.4 mL | SUBCUTANEOUS | Qty: 0.4
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
## 2015-01-20 NOTE — Progress Notes (Signed)
Pharmacy Medication Reconciliation     Recommendations/Findings:   1) pt said he does not remember what he takes but [facility]  2) called [facility] pharmacy and the pharmacist gave me the list below but he picked up these meds 09/15/14 for 30 day      supply       He requested refills 11/26/14 but never picked up from the pharmacy.    -Clarified PTA med list with the patient and [facility] pharmacy. PTA medication list was corrected to the following:     Prior to Admission Medications   Prescriptions Last Dose Informant Patient Reported? Taking?   DULoxetine (CYMBALTA) 30 mg capsule 10/16/2014  Yes Yes   Sig: Take 30 mg by mouth daily.   albuterol (PROVENTIL HFA, VENTOLIN HFA, PROAIR HFA) 90 mcg/actuation inhaler 10/16/2014  Yes Yes   Sig: Take 1 Puff by inhalation every four (4) hours as needed for Wheezing (q4-6h prn).   albuterol (PROVENTIL VENTOLIN) 2.5 mg /3 mL (0.083 %) nebulizer solution 10/16/2014  Yes Yes   Sig: 2.5 mg by Nebulization route four (4) times daily as needed for Wheezing.   amLODIPine (NORVASC) 5 mg tablet 09/19/2014  Yes Yes   Sig: Take 5 mg by mouth daily.   budesonide-formoterol (SYMBICORT) 80-4.5 mcg/actuation HFAA inhaler 10/16/2014  Yes Yes   Sig: Take 2 Puffs by inhalation two (2) times a day.   hydrochlorothiazide (HYDRODIURIL) 25 mg tablet 09/19/2014  Yes Yes   Sig: Take 25 mg by mouth daily.   lisinopril (PRINIVIL, ZESTRIL) 20 mg tablet 10/16/2014  Yes Yes   Sig: Take 20 mg by mouth daily.   pregabalin (LYRICA) 75 mg capsule 10/16/2014  Yes Yes   Sig: Take 75 mg by mouth two (2) times a day. sildenafil citrate (VIAGRA) 100 mg tablet 10/16/2014  Yes Yes   Sig: Take 100 mg by mouth as needed.   traMADol (ULTRAM) 50 mg tablet 10/16/2014  Yes Yes   Sig: Take 50 mg by mouth every six (6) hours as needed for Pain.      Facility-Administered Medications: None        Thank you,  Hazle QuantSOO J KIM, [facility]

## 2015-01-20 NOTE — Progress Notes (Signed)
Pt discharged to home, instructions given to patient, however the patient is agitated, belligerent and refusing to listen to discharge instructions.   The patient snatched the instructions out of security's hand. Pt's IV removed.

## 2015-01-20 NOTE — Progress Notes (Signed)
Pt was discahrged and left the floor escorted by security.  He remains alert and oriented, ambulating fine.  He shouts that I am the only person who treated him badly, and looks directly at me pointing with his cane.   His gait is much better and compared to earlier exam, can stand on his feet steadily moving around his cane in the air angrily. On his walk out, his gait is much improved.

## 2015-01-20 NOTE — Progress Notes (Signed)
Spiritual Care Assessment/Progress Notes    Zachary Gallagher Nooney [id number]  [id number]    1956-07-01  59 y.o.  male    Patient Telephone Number: [phone] (home)   Religious Affiliation: [employer]   Language: English   Extended Emergency Contact Information  Primary Emergency Contact[facility]   [place]  Home Phone: [phone]  Relation: Child   Patient Active Problem List    Diagnosis Date Noted   ??? Drug overdose, multiple drugs 01/19/2015   ??? Suicide attempt (HCC) 01/19/2015   ??? Transaminitis 01/19/2015   ??? Hypokalemia 01/19/2015   ??? Cocaine use 01/19/2015        Date: 01/20/2015       Level of Religious/Spiritual Activity:           Involved in faith tradition/spiritual practice             Not involved in faith tradition/spiritual practice           Spiritually oriented             Claims no spiritual orientation             seeking spiritual identity           Feels alienated from religious practice/tradition           Feels angry about religious practice/tradition           Spirituality/religious PRAYER IS a resource for coping at this time.           Not able to assess due to medical condition    Services Provided Today:           crisis intervention             reading Scriptures           spiritual assessment             prayer           empathic listening/emotional support           rites and rituals (cite in comments)           life review              religious support           theological development            advocacy           ethical dialog              blessing           bereavement support             support to family           anticipatory grief support            help with AMD           spiritual guidance             meditation      Spiritual Care Needs           Emotional Support           Spiritual/Religious Care           Loss/Adjustment           Advocacy/Referral /Ethics           No needs expressed at this time           Other: (note in comments)  Spiritual Care Plan  Follow up visits with pt/family           Provide materials           Schedule sacraments           Contact [facility]           Follow up as needed           Other: (note in comments)     Comments: Initial spiritual assessment in Med Surg/Tele Unit.  Mr. [facility]) greeted me with a smile, Miss Sherene Sires in his room as a Comptroller. [name] shared he attends [employer] regularly.  Provided calming presence and meditational breathing... In with the spirit... Out with the rest...  He appeared to be struggling with some emotions, reminded him that Jesus also hd those emotions, reminding heim of the shortest verse in the Bible, Jesus Wept. He did not share what he was on his mind though he actively held my hand as he struggled.  We spent some time in prayer both spoken and silent. Provided spiritual presence and prayer.  Advised of Chaplain Availability.  Visited by: Encarnacion Slates [employer]  Chaplain Paging Service [phone])

## 2015-01-20 NOTE — Progress Notes (Signed)
Pt looks fine in bed.  However states that he cannot walk well, because of pain in the knees and that he cannot see.  On ambulation he can stand fine on his legs, but he wobbles excessively and dramatically and in a manner not in keeping with any pathological process.  He looks around the room just fine , looks where is is stepping and demonstrates no visible gait or movement or other abnormalites that one would expect from someone who cannot see properly. He grabs his walker, handles his remote, and later removes his telemetry quite deflty.'  Also, although he complains of pain in the knees, and inability , he does walk to the other end of the room , angry and in a dramatic wobbly fashion without falling.     Once told that we will not give pain meds, he starts cursing angrily, rips off telemetry and demands pain meds, and to see the administrator.  Shouting "Yall bullshitting me, yall bullshitting me"      Per Pharmacy, he has not filled medications for a very long time, since October.    Seen by Psychiatry, without evidence of suicidality.    Patients presentation, cocaine abuse, dramatic physical examination, shouting and cursing, is in keeping with a combination of malingering and painseeking.  Will not prescribe any further narcotic or mood altering medication, as I believe this is not in the patients best interest    .    AMA Journal of Horticulturist, commercialthics  [employer]. May 2013, Volume 15, Number 5: 410-415.  Drug Seeking or Pain Crisis? Responsible Prescribing of Opioids in the Emergency Department        Between 1999 and the present, there has been a 300 percent increase in the prescribing of opiates in the [place] The misuse and abuse of prescription painkillers results in approximately 500,000 emergency department visits annually [1]. In 2008 more than 36,000 Americans died from drug overdoses, most of them caused by prescription opiates [2]. More than 12 million  Americans admitted using prescription opiates recreationally in 2010 [3].      1]US Department of Health and Human Services Substance Abuse and Mental Health Services Administration Center for [employer] Statistics and Quality. Drug Abuse Warning Network, 2010: national estimates of drug-related emergency department visits. https://www.young.com/http://www.samhsa.gov/data/2k13/DAWN2k10ED /DAWN2k10ED.htm. Accessed March 31, 2012.   2]Centers for Disease Control and Prevention. Vital signs: overdoses of prescription opioid pain relievers---[place], [id number]--2008. MMWR Morb Mortal Wkly Rep. 2011;60(43):1487-1492.   3]US Department of Health and Human Services Substance Abuse and Mental Health Services Administration Center for [employer] Health Statistics and Quality. Results from the 2010 National Survey on Drug Use and Health: summary of national findings. http://oas.https://www.patel-king.com/samhsa.gov/NSDUH/2k10NSDUH /2k10Results.htm#2.16. Accessed April 04, 2012.

## 2015-01-20 NOTE — Consults (Addendum)
PSYCHIATRIC CONSULTATION                         IDENTIFICATION:    Patient Name  PATTERSON HOLLENBAUGH   Date of Birth Sep 15, 1956   CSN [id number]   Medical Record Number  [id number]      Age  59 y.o.   PCP Violeta Gelinas, MD   Admit date:  01/19/2015    Room Number  210/01  @ [facility]   Date of Service  01/20/2015            HISTORY         REASON FOR HOSPITALIZATION/CONSULTATION:  A psychiatric consultation was requested to evaluate for depression s/p suicide gesture via OD  CC: "depressed"    HISTORY OF PRESENT ILLNESS:    The patient, ARTRELL LAWLESS, is a 59 y.o. BLACK OR [place] male with a past psychiatric history significant for polysub dependency, who was admitted to the medical floor for the treatment of Drug overdose, multiple drugs. Patient reports/evidences the following emotional symptoms:  depression and suicidal thoughts/threats.  The above symptoms have been present for a day PTA. These symptoms are of moderate severity. The symptoms are fleeting in nature.  Additional symptomatology include agitation and anger outbursts . The patient's condition has been precipitated by psychosocial stressors (conflicts with gf and her son, no job, rejected from [facility]).  Condition made worse by continued illicit drug use as well as treatment noncompliance. UDS +coc, MJ , BAL=0. No past psych admissions. No past drug rehab attempts.     ALLERGIES:  Allergies   Allergen Reactions   ??? Codeine Nausea Only      MEDICATIONS PRIOR TO ADMISSION:  Prescriptions prior to admission   Medication Sig   ??? budesonide-formoterol (SYMBICORT) 80-4.5 mcg/actuation HFAA inhaler Take 2 Puffs by inhalation two (2) times a day.   ??? pregabalin (LYRICA) 75 mg capsule Take 75 mg by mouth two (2) times a day.   ??? traMADol (ULTRAM) 50 mg tablet Take 50 mg by mouth every six (6) hours as needed for Pain.   ??? DULoxetine (CYMBALTA) 30 mg capsule Take 30 mg by mouth daily.    ??? amLODIPine (NORVASC) 5 mg tablet Take 5 mg by mouth daily.   ??? albuterol (PROVENTIL HFA, VENTOLIN HFA, PROAIR HFA) 90 mcg/actuation inhaler Take 1 Puff by inhalation every four (4) hours as needed for Wheezing (q4-6h prn).   ??? sildenafil citrate (VIAGRA) 100 mg tablet Take 100 mg by mouth as needed.   ??? hydrochlorothiazide (HYDRODIURIL) 25 mg tablet Take 25 mg by mouth daily.   ??? lisinopril (PRINIVIL, ZESTRIL) 20 mg tablet Take 20 mg by mouth daily.   ??? albuterol (PROVENTIL VENTOLIN) 2.5 mg /3 mL (0.083 %) nebulizer solution 2.5 mg by Nebulization route four (4) times daily as needed for Wheezing.      PAST MEDICAL HISTORY:  Past Medical History   Diagnosis Date   ??? Hypertension    ??? Psychiatric disorder      depression, anxiety   ??? Chronic obstructive pulmonary disease (HCC)    ??? Ill-defined condition      chronic back pain   History reviewed. No pertinent past surgical history.   SOCIAL HISTORY:   History     Social History   ??? Marital Status: DIVORCED     Spouse Name: N/A     Number of Children: N/A   ??? Years of  Education: N/A     Occupational History   ??? Not on file.   Social History Main Topics   ??? Smoking status: Never Smoker    ??? Smokeless tobacco: Not on file   ??? Alcohol Use: Yes      Comment: "every now and then"   ??? Drug Use: Yes     Special: Marijuana, Cocaine      Comment: tried cocaine last night   ??? Sexual Activity: Not on file     Other Topics Concern   ??? Not on file     Social History Narrative    He has applied for disability recently, second application. Longstanding h/o drug dependency. Has a gf of 16 yrs. No job. Did not finish HS. H/o mulitple arrests, including failure to pay child support---outstanding charge at present. Has many friends.       FAMILY HISTORY: History reviewed. No pertinent family history.  Family History   Problem Relation Age of Onset   ??? Other       Pt states he does not know of any medical issues in his family       REVIEW of SYSTEMS:    Psychological ROS: positive for - anxiety, depression and irritability  negative for - suicidal ideation  Pertinent items are noted in the History of Present Illness.  All other Systems reviewed and are considered negative.           MENTAL STATUS EXAM & VITALS       MENTAL STATUS EXAM (MSE):    MSE FINDINGS ARE WITHIN NORMAL LIMITS (WNL) UNLESS OTHERWISE STATED BELOW.    Orientation oriented to time, place and person Vital Signs (BP,Pulse, Temp) See below (reviewed 01/20/2015); vital signs are WNL if not listed below.   Gait and Station Stable, no ataxia   Abnormal Muscular Movements/Tone/Behavior No EPS, no Tardive Dyskinesia, no abnormal muscular movements; wnl tone   Relations cooperative and unreliable   General Appearance:  age appropriate and casually dressed   Language No aphasia or dysarthria   Speech:  hyperverbal   Thought Processes logical, wnl rate of thoughts, good abstract reasoning and computation   Thought Associations goal directed and logical   Thought Content free of delusions and free of hallucinations   Suicidal Ideations no plan , no intention and none   Homicidal Ideations no plan , no intention and none   Mood:  angry, depressed and irritable   Affect:  anxious and euthymic   Memory recent  adequate   Memory remote:  adequate   Concentration/Attention:  adequate   Fund of Knowledge average   Insight:  fair   Reliability untruthful   Judgment:  fair            VITALS:     Patient Vitals for the past 24 hrs:   Temp Pulse Resp BP SpO2   01/20/15 1121 97.9 ??F (36.6 ??C) 74 18 (!) 175/126 mmHg 98 %   01/20/15 0727 97.6 ??F (36.4 ??C) 71 18 (!) 173/116 mmHg 100 %   01/20/15 0426 97.2 ??F (36.2 ??C) 75 15 (!) 174/107 mmHg 97 %   01/19/15 2340 97.6 ??F (36.4 ??C) 72 16 (!) 144/104 mmHg 97 %   01/19/15 2113 97.1 ??F (36.2 ??C) 75 17 (!) 178/115 mmHg 98 %   01/19/15 1945 - 79 12 (!) 151/96 mmHg 97 %   01/19/15 1920 98.1 ??F (36.7 ??C) 72 15 (!) 148/97 mmHg 99 %   01/19/15 1845 -  77 14 146/85 mmHg 98 %    01/19/15 1815 - 80 14 143/83 mmHg 99 %   01/19/15 1745 - 79 24 148/85 mmHg 100 %   01/19/15 1730 - 72 20 (!) 134/103 mmHg 100 %   01/19/15 1728 - 82 30 (!) 177/106 mmHg 100 %   01/19/15 1715 - 70 12 (!) 169/107 mmHg 98 %   01/19/15 1702 - 81 19 (!) 162/113 mmHg 99 %   01/19/15 1700 - 78 24 (!) 166/113 mmHg 100 %   01/19/15 1645 - 78 13 (!) 151/102 mmHg 97 %   01/19/15 1630 - 80 12 (!) 151/98 mmHg 97 %   01/19/15 1615 - 77 12 (!) 149/91 mmHg 100 %   01/19/15 1545 - 74 18 (!) 178/103 mmHg 100 %   01/19/15 1543 - 77 16 - 100 %   01/19/15 1500 - 86 24 (!) 185/107 mmHg 100 %   01/19/15 1440 - 76 16 (!) 139/94 mmHg 99 %   01/19/15 1359 - 82 22 (!) 170/101 mmHg 100 %              DATA     LABORATORY DATA:  Labs Reviewed   SALICYLATE - Abnormal; Notable for the following:     SALICYLATE <2.8 (*)     All other components within normal limits   ACETAMINOPHEN - Abnormal; Notable for the following:     ACETAMINOPHEN <2 (*)     All other components within normal limits   METABOLIC PANEL, COMPREHENSIVE - Abnormal; Notable for the following:     Potassium 2.9 (*)     Glucose 175 (*)     ALT 86 (*)     AST 60 (*)     A-G Ratio 1.0 (*)     All other components within normal limits   VENOUS BLOOD GAS - Abnormal; Notable for the following:     VENOUS PH 7.46 (*)     VENOUS PCO2 39 (*)     VENOUS PO2 60 (*)     VENOUS O2 SATURATION 92 (*)     All other components within normal limits   DRUG SCREEN, URINE - Abnormal; Notable for the following:     COCAINE POSITIVE (*)     THC (TH-CANNABINOL) POSITIVE (*)     All other components within normal limits   SALICYLATE - Abnormal; Notable for the following:     SALICYLATE <3.1 (*)     All other components within normal limits   METABOLIC PANEL, COMPREHENSIVE - Abnormal; Notable for the following:     Glucose 124 (*)     ALT 82 (*)     AST 57 (*)     Albumin 3.4 (*)     A-G Ratio 0.9 (*)     All other components within normal limits   ACETAMINOPHEN - Abnormal; Notable for the following:      ACETAMINOPHEN <2 (*)     All other components within normal limits   HEPATIC FUNCTION PANEL - Abnormal; Notable for the following:     Albumin 3.2 (*)     A-G Ratio 0.9 (*)     AST 53 (*)     All other components within normal limits   METABOLIC PANEL, BASIC - Abnormal; Notable for the following:     Potassium 3.4 (*)     Chloride 109 (*)     Glucose 125 (*)     Calcium 8.1 (*)  All other components within normal limits   GLUCOSE, POC - Abnormal; Notable for the following:   Glucose (POC) 151 (*)     All other components within normal limits   GLUCOSE, POC - Abnormal; Notable for the following:     Glucose (POC) 145 (*)     All other components within normal limits   GLUCOSE, POC - Abnormal; Notable for the following:     Glucose (POC) 115 (*)     All other components within normal limits   ETHYL ALCOHOL   DRUG SCREEN, URINE   CBC WITH AUTOMATED DIFF   LIPASE   SAMPLES BEING HELD   MAGNESIUM   CBC W/O DIFF   Admission on 01/19/2015   Component Date Value Ref Range Status   ??? ALCOHOL(ETHYL),SERUM 01/19/2015 <10  <10 MG/DL Final   ??? SALICYLATE 16/09/9603 <5.4* 2.8 - 20.0 MG/DL Final   ??? ACETAMINOPHEN 01/19/2015 <2* 10 - 30 ug/mL Final   ??? Ventricular Rate 01/19/2015 82   Final   ??? Atrial Rate 01/19/2015 82   Final   ??? P-R Interval 01/19/2015 146   Final   ??? QRS Duration 01/19/2015 76   Final   ??? Q-T Interval 01/19/2015 364   Final   ??? QTC Calculation (Bezet) 01/19/2015 425   Final   ??? Calculated P Axis 01/19/2015 65   Final   ??? Calculated R Axis 01/19/2015 -10   Final   ??? Calculated T Axis 01/19/2015 15   Final   ??? Diagnosis 01/19/2015    Final                    Value:Normal sinus rhythm  Nonspecific ST and T wave abnormality  When compared with ECG of 25-Aug-2007 06:01,  Nonspecific T wave abnormality now evident in Lateral leads  Confirmed by [facility] [phone]) on 01/19/2015 11:15:22 PM     ??? WBC 01/19/2015 4.6  4.1 - 11.1 K/uL Final   ??? RBC 01/19/2015 4.99  4.10 - 5.70 M/uL Final    ??? HGB 01/19/2015 14.6  12.1 - 17.0 g/dL Final   ??? HCT 01/19/2015 42.5  36.6 - 50.3 % Final   ??? MCV 01/19/2015 85.2  80.0 - 99.0 FL Final   ??? MCH 01/19/2015 29.3  26.0 - 34.0 PG Final   ??? MCHC 01/19/2015 34.4  30.0 - 36.5 g/dL Final   ??? RDW 01/19/2015 14.5  11.5 - 14.5 % Final   ??? PLATELET 01/19/2015 198  150 - 400 K/uL Final   ??? NEUTROPHILS 01/19/2015 39  32 - 75 % Final   ??? LYMPHOCYTES 01/19/2015 47  12 - 49 % Final   ??? MONOCYTES 01/19/2015 9  5 - 13 % Final   ??? EOSINOPHILS 01/19/2015 4  0 - 7 % Final ??? BASOPHILS 01/19/2015 1  0 - 1 % Final   ??? ABS. NEUTROPHILS 01/19/2015 1.8  1.8 - 8.0 K/UL Final   ??? ABS. LYMPHOCYTES 01/19/2015 2.2  0.8 - 3.5 K/UL Final   ??? ABS. MONOCYTES 01/19/2015 0.4  0.0 - 1.0 K/UL Final   ??? ABS. EOSINOPHILS 01/19/2015 0.2  0.0 - 0.4 K/UL Final   ??? ABS. BASOPHILS 01/19/2015 0.0  0.0 - 0.1 K/UL Final   ??? Lipase 01/19/2015 115  73 - 393 U/L Final   ??? Sodium 01/19/2015 141  136 - 145 mmol/L Final   ??? Potassium 01/19/2015 2.9* 3.5 - 5.1 mmol/L Final   ??? Chloride 01/19/2015 105  97 - 108 mmol/L Final   ??? CO2 01/19/2015 25  21 - 32 mmol/L Final   ??? Anion gap 01/19/2015 11  5 - 15 mmol/L Final   ??? Glucose 01/19/2015 175* 65 - 100 mg/dL Final   ??? BUN 01/19/2015 15  6 - 20 MG/DL Final   ??? Creatinine 01/19/2015 1.09  0.70 - 1.30 MG/DL Final   ??? BUN/Creatinine ratio 01/19/2015 14  12 - 20   Final   ??? GFR est AA 01/19/2015 >60  >60 ml/min/1.74m Final   ??? GFR est non-AA 01/19/2015 >60  >60 ml/min/1.733mFinal ??? Calcium 01/19/2015 8.8  8.5 - 10.1 MG/DL Final   ??? Bilirubin, total 01/19/2015 0.7  0.2 - 1.0 MG/DL Final   ??? ALT 01/19/2015 86* 12 - 78 U/L Final   ??? AST 01/19/2015 60* 15 - 37 U/L Final   ??? Alk. phosphatase 01/19/2015 87  45 - 117 U/L Final   ??? Protein, total 01/19/2015 7.5  6.4 - 8.2 g/dL Final   ??? Albumin 01/19/2015 3.7  3.5 - 5.0 g/dL Final   ??? Globulin 01/19/2015 3.8  2.0 - 4.0 g/dL Final   ??? A-G Ratio 01/19/2015 1.0* 1.1 - 2.2   Final   ??? Glucose (POC) 01/19/2015 151* 65 - 100 mg/dL Final    ??? Performed by 01/19/2015 POClemetine Marker Final   ??? VENOUS PH 01/19/2015 7.46* 7.32 - 7.42   Final   ??? VENOUS PCO2 01/19/2015 39* 41 - 51 mmHg Final   ??? VENOUS PO2 01/19/2015 60* 25 - 40 mmHg Final   ??? VENOUS O2 SATURATION 01/19/2015 92* 65 - 88 % Final   ??? VENOUS BICARBONATE 01/19/2015 27  23 - 28 mmol/L Final   ??? VENOUS BASE EXCESS 01/19/2015 3.3   Final   ??? O2 METHOD 01/19/2015 ROOM AIR   Final   ??? Sample source 01/19/2015 VENOUS   Final   ??? SITE 01/19/2015 OTHER   Final   ??? AMPHETAMINE 01/20/2015 NEGATIVE   NEG   Final   ??? BARBITURATES 01/20/2015 NEGATIVE   NEG   Final   ??? BENZODIAZEPINE 01/20/2015 NEGATIVE   NEG   Final   ??? COCAINE 01/20/2015 POSITIVE* NEG   Final   ??? METHADONE 01/20/2015 NEGATIVE   NEG   Final   ??? OPIATES 01/20/2015 NEGATIVE   NEG   Final   ??? PCP(PHENCYCLIDINE) 01/20/2015 NEGATIVE   NEG   Final   ??? THC (TH-CANNABINOL) 01/20/2015 POSITIVE* NEG   Final   ??? Drug screen comment 01/20/2015 (NOTE)   Final   ??? Magnesium 01/19/2015 2.1  1.6 - 2.4 mg/dL Final   ??? SALICYLATE 0214/43/15401<0.82.8 - 20.0 MG/DL Final   ??? Sodium 01/19/2015 143  136 - 145 mmol/L Final   ??? Potassium 01/19/2015 3.7  3.5 - 5.1 mmol/L Final   ??? Chloride 01/19/2015 107  97 - 108 mmol/L Final   ??? CO2 01/19/2015 29  21 - 32 mmol/L Final   ??? Anion gap 01/19/2015 7  5 - 15 mmol/L Final   ??? Glucose 01/19/2015 124* 65 - 100 mg/dL Final   ??? BUN 01/19/2015 15  6 - 20 MG/DL Final   ??? Creatinine 01/19/2015 1.08  0.70 - 1.30 MG/DL Final   ??? BUN/Creatinine ratio 01/19/2015 14  12 - 20   Final   ??? GFR est AA 01/19/2015 >60  >60 ml/min/1.7356minal   ??? GFR est non-AA 01/19/2015 >60  >60 ml/min/1.37m75mnal   ???  Calcium 01/19/2015 8.6  8.5 - 10.1 MG/DL Final   ??? Bilirubin, total 01/19/2015 0.5  0.2 - 1.0 MG/DL Final   ??? ALT 01/19/2015 82* 12 - 78 U/L Final   ??? AST 01/19/2015 57* 15 - 37 U/L Final   ??? Alk. phosphatase 01/19/2015 89  45 - 117 U/L Final   ??? Protein, total 01/19/2015 7.3  6.4 - 8.2 g/dL Final    ??? Albumin 01/19/2015 3.4* 3.5 - 5.0 g/dL Final   ??? Globulin 01/19/2015 3.9  2.0 - 4.0 g/dL Final   ??? A-G Ratio 01/19/2015 0.9* 1.1 - 2.2   Final   ??? ACETAMINOPHEN 01/19/2015 <2* 10 - 30 ug/mL Final   ??? Protein, total 01/20/2015 6.6  6.4 - 8.2 g/dL Final   ??? Albumin 01/20/2015 3.2* 3.5 - 5.0 g/dL Final   ??? Globulin 01/20/2015 3.4  2.0 - 4.0 g/dL Final   ??? A-G Ratio 01/20/2015 0.9* 1.1 - 2.2   Final   ??? Bilirubin, total 01/20/2015 0.4  0.2 - 1.0 MG/DL Final   ??? Bilirubin, direct 01/20/2015 0.2  0.0 - 0.2 MG/DL Final   ??? Alk. phosphatase 01/20/2015 79  45 - 117 U/L Final   ??? AST 01/20/2015 53* 15 - 37 U/L Final   ??? ALT 01/20/2015 76  12 - 78 U/L Final   ??? Sodium 01/20/2015 141  136 - 145 mmol/L Final   ??? Potassium 01/20/2015 3.4* 3.5 - 5.1 mmol/L Final   ??? Chloride 01/20/2015 109* 97 - 108 mmol/L Final   ??? CO2 01/20/2015 25  21 - 32 mmol/L Final   ??? Anion gap 01/20/2015 7  5 - 15 mmol/L Final   ??? Glucose 01/20/2015 125* 65 - 100 mg/dL Final ??? BUN 01/20/2015 13  6 - 20 MG/DL Final   ??? Creatinine 01/20/2015 0.93  0.70 - 1.30 MG/DL Final   ??? BUN/Creatinine ratio 01/20/2015 14  12 - 20   Final   ??? GFR est AA 01/20/2015 >60  >60 ml/min/1.76m Final   ??? GFR est non-AA 01/20/2015 >60  >60 ml/min/1.718mFinal   ??? Calcium 01/20/2015 8.1* 8.5 - 10.1 MG/DL Final   ??? WBC 01/20/2015 4.2  4.1 - 11.1 K/uL Final   ??? RBC 01/20/2015 4.79  4.10 - 5.70 M/uL Final   ??? HGB 01/20/2015 14.5  12.1 - 17.0 g/dL Final   ??? HCT 01/20/2015 41.5  36.6 - 50.3 % Final   ??? MCV 01/20/2015 86.6  80.0 - 99.0 FL Final   ??? MCH 01/20/2015 30.3  26.0 - 34.0 PG Final   ??? MCHC 01/20/2015 34.9  30.0 - 36.5 g/dL Final   ??? RDW 01/20/2015 14.2  11.5 - 14.5 % Final   ??? PLATELET 01/20/2015 182  150 - 400 K/uL Final   ??? Glucose (POC) 01/20/2015 145* 65 - 100 mg/dL Final   ??? Performed by 01/20/2015 McNeill LoftPCT)   Final   ??? Glucose (POC) 01/20/2015 115* 65 - 100 mg/dL Final   ??? Performed by 01/20/2015 McNeill LoftPCT)   Final        RADIOLOGY REPORTS:     Results from Hospital Encounter encounter on 05/31/12   XR CHEST PA LAT   Narrative **Final Report**      ICD Codes / Adm.Diagnosis: [id number] Dizziness  Chest Pain  Examination:  CR CHEST PA AND LATERAL  - [id number] May 31 2012  8:24AM  Accession No:  [id number]:  Pain  REPORT:  INDICATION:   Pain    COMPARISON: None    FINDINGS:    Frontal and lateral views of the chest demonstrate a normal   cardiomediastinal silhouette. The lungs are adequately expanded.  There is   no edema, effusion, consolidation, or pneumothorax. The osseous structures   are unremarkable.       IMPRESSION:  No acute process.           Signing/Reading Doctor: Leonie Green LEWIS [phone])    Approved: Leonie Green LEWIS [id number])  May 31 2012  8:25AM                                 No results found.           MEDICATIONS       ALL MEDICATIONS  Current Facility-Administered Medications   Medication Dose Route Frequency   ??? [START ON 01/21/2015] citalopram (CELEXA) tablet 10 mg  10 mg Oral DAILY   ??? sodium chloride (NS) flush 5-10 mL  5-10 mL IntraVENous Q8H   ??? sodium chloride (NS) flush 5-10 mL  5-10 mL IntraVENous PRN   ??? zolpidem (AMBIEN) tablet 5 mg  5 mg Oral QHS PRN   ??? enoxaparin (LOVENOX) injection 40 mg  40 mg SubCUTAneous Q24H   ??? albuterol (PROVENTIL VENTOLIN) nebulizer solution 2.5 mg  2.5 mg Nebulization Q6H PRN   ??? ibuprofen (MOTRIN) tablet 200 mg  200 mg Oral Q6H PRN   ??? ibuprofen (ADVIL;MOTRIN) 100 mg/5 mL oral suspension 200 mg  200 mg Oral Q6H PRN      SCHEDULED MEDICATIONS  Current Facility-Administered Medications   Medication Dose Route Frequency   ??? [START ON 01/21/2015] citalopram (CELEXA) tablet 10 mg  10 mg Oral DAILY   ??? sodium chloride (NS) flush 5-10 mL  5-10 mL IntraVENous Q8H   ??? enoxaparin (LOVENOX) injection 40 mg  40 mg SubCUTAneous Q24H                ASSESSMENT & PLAN        The patient ARAFAT COCUZZA is a 59 y.o.  male who presents at this time for treatment of the following diagnoses:   Patient Active Hospital Problem List:   Drug overdose, multiple drugs (01/19/2015)    Assessment: resolved. Seems more c/w a gesture, not a true attempt. Done at home with friends in the house. Discovered immediately. Not suicidal any longer    Plan: o/p therapy, sobriety    Polysubstance dependence ([place]) (01/20/2015)    Assessment: chronic, severe. Pt not interested in sobriety. Poor prognosis    Plan: pt refuses drug rehab option   Substance induced mood disorder ([place]) (01/20/2015)    Assessment: improved    Plan: pt wants an AD, will likely not help, but favorable R/B. I/C given   Non-compliance with treatment (01/20/2015)    Assessment: chronic    Plan: educate, support        Thank you very much for the opportunity to participate in the care of your patient. I will continue to follow up with patient as deemed appropriate.       A coordinated, multidisplinary treatment team round was conducted with the patient; that includes the nurse, unit pharmcist, Catering manager all present.     The following regarding medications was addressed during rounds with patient:   the risks and benefits of the proposed medication. The patient was  given the opportunity to ask questions. Informed consent given to the use of the above medications.     I will continue to adjust psychiatric and non-psychiatric medications (see above "medication" section and orders section for details) as deemed appropriate & based upon diagnoses and response to treatment.     I have reviewed admission (and previous/old) labs and medical tests in the EHR and or transferring hospital documents. I will continue to order blood tests/labs and diagnostic tests as deemed appropriate and review results as they become available (see orders for details).    I have reviewed old psychiatric and medical records available in the EHR. I Will order additional psychiatric records from other institutions to  further elucidate the nature of patient's psychopathology and review once available.    I will gather additional collateral information from friends, family and o/p treatment team to further elucidate the nature of patient's psychopathology and baselline level of psychiatric functioning.                     SIGNED:    Leandra Kern, MD  01/20/2015

## 2015-01-20 NOTE — Progress Notes (Addendum)
Care Management Interventions  PCP Verified by CM?: Yes  Mode of Transport at Discharge: Self  Current Support Network: Other (with girlfriend)  Confirm Follow Up Transport: Self (family)  Plan discussed with Pt/Family/Caregiver: Yes  Discharge Location  Discharge Placement: Home with outpatient services    Readmission Risk Assessment: Moderate Risk and MSSP/Good Help ACO patients    RRAT Score: 10 - 18    Initial Assessment: Pt is a 59 yo male admitted under Observation for intentional overdose. CM met with the patient to discuss discharge planning and review facesheet to verify demographics. Patient is divorced and unemployed. Patient states he resides with his girlfriend and his dog ???[employer]???.  Patient denies having any social or emotional support. Patient significant other and friend were visiting and patient asked that they excuse themselves while assessment was being completed. Patient acknowledges concerns regarding her current health conditions, financial stressors, and relationship stressors. Patient became anxious, physically worked up, and focus on girlfriends son stealing from home. He expressed having a great deal of stress in his home environment, with finances and in his relationship with his significant other. Patient has declined consent for CM to communicate discharge plan with girlfriend and indicates that ???god will find a way to get home???.       Patient is self-pay. Patient states his PCP is Zachary Gallagher. Patient has [facility] and fills his prescriptions at [facility]  Patient states that he has COPD but does not currently see any specialist for this or any other health conditions. Patient reports need for assistance with paying for medication at discharge. CM will complete medication voucher once discharged and prescriptions are available. Patient has consented for CM to make arrangements for follow up appointments with PCP and specialists.  CM attempted to schedule appointment with PCP but office closed for lunch. Will continue to pursue appointment.    Patient denies use of nebulizer. Patient does use cane to assist with ambulation. Patient has cane with him at the hospital.     Referral sent for patient to be screen by [facility]. Patient states he has applied for disability and inquired about whether SW could check status. CM informed patient that [facility] will follow up with him and assist with his question regarding disability status.     Patient awaiting [facility] consult.    Observation status and letter review with patient. Signature obtained. Signed copy placed on patient chart.     Emergency Contact: [place], [phone]     Pertinent Medical Hx: COPD, Depression and Anxiety, HTN, (see H & P)    PCP/Specialists:  Zachary Gallagher- [phone]  Community Services: None reported    DME: Cane    Moderate Risk Care Transition Plan:  1. Evaluate for [facility] or H2H, SNF, acute rehab, community care coordination of resources. Will further assess upon discharge.  2. Involve patient/caregiver in assessment, planning, education and implement of intervention. Discussed patient's need for PCP follow up, patient agreed to have CM make arrangements. Patient allowed to ask questions. None voiced at this time. CM to continue to participate in IDT rounds and educate patient as needed. CM will continue to discuss discharge needs upon discharge.    3. CM daily patient care huddles/interdisciplinary rounds. Rounded with IDT  4. PCP/Specialist appointment within 5 ??? 7 days made prior to discharge.  Pending.  5. Facilitate transportation and logistics for follow-up appointments. Self/Family  6. Medication reconciliation ??? Pharmacy Med rec completed by MD  7. Formal handoff between hospital provider and  post-acute provider to transition patient Will be done at time of discharge  8. Handoff to [facility] Nurse Navigator or PCP practice. Pandora Leiter, RN  [phone]      Addendum:    Patient was discharged today. Appointment arranged with PCP Zachary Gallagher for 01/28/2015 at 11:30 am.

## 2015-01-21 ENCOUNTER — Inpatient Hospital Stay
Admit: 2015-01-21 | Discharge: 2015-01-21 | Disposition: A | Discharge status: Home / Self Care | Payer: Self-pay | Attending: Emergency Medicine

## 2015-01-21 DIAGNOSIS — I10 Essential (primary) hypertension: Secondary | ICD-10-CM

## 2015-01-21 LAB — EKG, 12 LEAD, INITIAL
Atrial Rate: 70 {beats}/min
Calculated P Axis: 50 degrees
Calculated R Axis: -10 degrees
Calculated T Axis: 8 degrees
Diagnosis: NORMAL
P-R Interval: 146 ms
Q-T Interval: 418 ms
QRS Duration: 74 ms
QTC Calculation (Bezet): 451 ms
Ventricular Rate: 70 {beats}/min

## 2015-01-21 MED ORDER — CLONIDINE 0.1 MG TAB
0.1 mg | ORAL_TABLET | Freq: Two times a day (BID) | ORAL | Status: AC
Start: 2015-01-21 — End: ?

## 2015-01-21 MED ORDER — CLONIDINE 0.1 MG TAB
0.1 mg | ORAL | Status: AC
Start: 2015-01-21 — End: 2015-01-21
  Administered 2015-01-21: 21:00:00 via ORAL

## 2015-01-21 MED FILL — CLONIDINE 0.1 MG TAB: 0.1 mg | ORAL | Qty: 2

## 2015-01-21 NOTE — ED Notes (Signed)
RN agrees with the nurse's assessment

## 2015-01-21 NOTE — Telephone Encounter (Signed)
Verified name/DOB. Pt's speech very pressured and sounds paranoid. He says that he was released from hospital too soon, and he "cannot see" because his bp is so high. Encouraged pt multiple times to call PCP. Referral placed with patient advocate. Pt unable to participate in further assessment.    Selinda MichaelsKara Weiland, MSW  [phone]

## 2015-01-21 NOTE — ED Notes (Signed)
Emergency Department Nursing Plan of Care       The Nursing Plan of Care is developed from the Nursing assessment and Emergency Department Attending provider initial evaluation.  The plan of care may be reviewed in the ???ED Provider note???.    The Plan of Care was developed with the following considerations:   Patient / Family readiness to learn indicated ZO:XWRUEAVWUJby:verbalized understanding  Persons(s) to be included in education: patient  Barriers to Learning/Limitations:No    Signed     Nolon StallsHannah M Arville Postlewaite    01/21/2015   4:45 PM

## 2015-01-21 NOTE — ED Notes (Signed)
Patient given copy of discharge instructions and 1 script(s). Patient given a current medication reconciliation form and verbalized understanding of their medications and importance of discussing medications at follow-up. Patient stable at discharge. Ambulatory out of ED with self.

## 2015-01-21 NOTE — ED Provider Notes (Signed)
Patient is a 59 y.o. male presenting with hypertension. The history is provided by medical records and the patient. No language interpreter was used.   Hypertension   This is a chronic problem. The problem has not changed since onset.Associated symptoms include blurred vision. Pertinent negatives include no chest pain, no orthopnea, no palpitations, no anxiety, no confusion, no malaise/fatigue, no headaches, no tinnitus, no neck pain, no peripheral edema, no dizziness and no vomiting. Risk factors include male gender, hypertension and non-compliant.      Pt discharged from [facility] 2 days ago for overdose in self harm attempt. Apparently he was escorted from hospital after being refused pain medications on discharge. He is here today complaining of blurred vision due to his high blood pressure. He states that he has been taking his hypertension meds regularly (though pharmacy states last filled 30 day supply in October). Pt is in fact again requesting pain medications and something to help him sleep, having admitted to cocaine use recently. I explained to him that these would need to be addressed by PMD due to his recent attempt at self harm by overdose.     Past Medical History:   Diagnosis Date   ??? Hypertension    ??? Psychiatric disorder      depression, anxiety   ??? Chronic obstructive pulmonary disease (HCC)    ??? Ill-defined condition      chronic back pain       History reviewed. No pertinent past surgical history.      Family History:   Problem Relation Age of Onset   ??? Other       Pt states he does not know of any medical issues in his family       History     Social History   ??? Marital Status: DIVORCED     Spouse Name: N/A     Number of Children: N/A   ??? Years of Education: N/A     Occupational History   ??? Not on file.     Social History Main Topics   ??? Smoking status: Never Smoker    ??? Smokeless tobacco: Not on file   ??? Alcohol Use: Yes      Comment: "every now and then"   ??? Drug Use: Yes      Special: Marijuana, Cocaine      Comment: tried cocaine last night   ??? Sexual Activity:     Partners: Female     Other Topics Concern   ??? Not on file     Social History Narrative    He has applied for disability recently, second application. Longstanding h/o drug dependency. Has a gf of 16 yrs. No job. Did not finish HS. H/o mulitple arrests, including failure to pay child support---outstanding charge at present. Has many friends.            ALLERGIES: Codeine      Review of Systems   Constitutional: Negative for malaise/fatigue.   HENT: Negative for tinnitus.    Eyes: Positive for blurred vision and visual disturbance.   Cardiovascular: Negative for chest pain, palpitations and orthopnea.   Gastrointestinal: Negative for vomiting.   Musculoskeletal: Negative for neck pain.   Neurological: Negative for dizziness and headaches.   Psychiatric/Behavioral: Positive for sleep disturbance. Negative for confusion. The patient is nervous/anxious.    All other systems reviewed and are negative.      Filed Vitals:    01/21/15 1520 01/21/15 1628   BP: 161/105  152/108   Pulse: 87 86   Temp: 98.4 ??F (36.9 ??C)    Resp: 16 18   Height: 5\' 11"  (1.803 m)    Weight: 72.576 kg (160 lb)    SpO2: 97% 98%            Physical Exam   Constitutional: He is oriented to person, place, and time. He appears well-developed and well-nourished. No distress.   Thin Black male with mod pressured speech   HENT:   Head: Normocephalic and atraumatic.   Right Ear: External ear normal.   Left Ear: External ear normal.   Nose: Nose normal.   Mouth/Throat: Oropharynx is clear and moist. No oropharyngeal exudate.   Eyes: Conjunctivae and EOM are normal. Pupils are equal, round, and reactive to light. Right eye exhibits no discharge. Left eye exhibits no discharge. No scleral icterus.   Neck: Normal range of motion. Neck supple. No JVD present. No tracheal deviation present. No thyromegaly present.    Cardiovascular: Normal rate, regular rhythm and normal heart sounds.    No murmur heard.  Pulmonary/Chest: Effort normal and breath sounds normal. No stridor. No respiratory distress. He has no wheezes. He has no rales.   Abdominal: Soft. He exhibits no distension. There is no tenderness.   Musculoskeletal: Normal range of motion.   Lymphadenopathy:     He has no cervical adenopathy.   Neurological: He is alert and oriented to person, place, and time. No cranial nerve deficit. Coordination normal.   Skin: Skin is warm and dry. No rash noted. He is not diaphoretic. No erythema.   Psychiatric: His behavior is normal. Judgment and thought content normal.   Nursing note and vitals reviewed.       MDM    Procedures

## 2015-01-21 NOTE — ED Notes (Signed)
Pt in ED c/o high blood pressure, states that when he checked his pressure earlier it was 150/100. States that he "took one of his pills this morning, but pressure was so high, couldn't see what". Pt in no acute distress.

## 2015-12-17 ENCOUNTER — Inpatient Hospital Stay
Admit: 2015-12-17 | Discharge: 2015-12-17 | Disposition: A | Discharge status: Home / Self Care | Payer: Self-pay | Attending: Emergency Medicine

## 2015-12-17 DIAGNOSIS — S01112A Laceration without foreign body of left eyelid and periocular area, initial encounter: Secondary | ICD-10-CM

## 2015-12-17 MED ORDER — IBUPROFEN 400 MG TAB
400 mg | ORAL | Status: AC
Start: 2015-12-17 — End: 2015-12-17
  Administered 2015-12-17: 20:00:00 via ORAL

## 2015-12-17 MED ORDER — LIDOCAINE HCL 2 % (20 MG/ML) IJ SOLN
20 mg/mL (2 %) | Freq: Once | INTRAMUSCULAR | Status: AC
Start: 2015-12-17 — End: 2015-12-17
  Administered 2015-12-17: 20:00:00 via INTRADERMAL

## 2015-12-17 MED ORDER — IPRATROPIUM-ALBUTEROL 2.5 MG-0.5 MG/3 ML NEB SOLUTION
2.5 mg-0.5 mg/3 ml | RESPIRATORY_TRACT | Status: AC
Start: 2015-12-17 — End: 2015-12-17
  Administered 2015-12-17: 21:00:00 via RESPIRATORY_TRACT

## 2015-12-17 MED FILL — LIDOCAINE HCL 2 % (20 MG/ML) IJ SOLN: 20 mg/mL (2 %) | INTRAMUSCULAR | Qty: 20

## 2015-12-17 MED FILL — IPRATROPIUM-ALBUTEROL 2.5 MG-0.5 MG/3 ML NEB SOLUTION: 2.5 mg-0.5 mg/3 ml | RESPIRATORY_TRACT | Qty: 3

## 2015-12-17 MED FILL — IBUPROFEN 400 MG TAB: 400 mg | ORAL | Qty: 2

## 2015-12-17 NOTE — ED Provider Notes (Signed)
HPI Comments: Zachary RaoSamuel S Gallagher is a 59 y.o. male with PMHx of HTN, and COPD [facility] c/o head laceration 30 PTA. Pt notes that he was outside, and slipped causing him to have a mechanical fall and hit his head. He has a laceration to his left eyebrow. Pt denies LOC, alcohol use, or smoking (quit 2 months ago).       PCP: Tommi Rumpsasha Dickerson, MD    There are no other complaints, changes, or physical findings at this time.      The history is provided by the patient.        Past Medical History:   Diagnosis Date   ??? Chronic obstructive pulmonary disease (HCC)    ??? Hypertension    ??? Ill-defined condition      chronic back pain   ??? Psychiatric disorder      depression, anxiety       No past surgical history on file.      Family History:   Problem Relation Age of Onset   ??? Other       Pt states he does not know of any medical issues in his family       Social History     Social History   ??? Marital status: DIVORCED     Spouse name: N/A   ??? Number of children: N/A   ??? Years of education: N/A     Occupational History   ??? Not on file.     Social History Main Topics   ??? Smoking status: Never Smoker   ??? Smokeless tobacco: Not on file   ??? Alcohol use Yes      Comment: "every now and then"   ??? Drug use: Yes     Special: Marijuana, Cocaine      Comment: tried cocaine last night   ??? Sexual activity: Yes     Partners: Female     Other Topics Concern   ??? Not on file     Social History Narrative    He has applied for disability recently, second application. Longstanding h/o drug dependency. Has a gf of 16 yrs. No job. Did not finish HS. H/o mulitple arrests, including failure to pay child support---outstanding charge at present. Has many friends.          ALLERGIES: Codeine    Review of Systems   Constitutional: Negative.  Negative for activity change, appetite change, chills, fatigue, fever and unexpected weight change.   HENT: Negative.  Negative for congestion, hearing loss, rhinorrhea, sneezing and voice change.     Eyes: Negative.  Negative for pain and visual disturbance.   Respiratory: Negative.  Negative for apnea, cough, choking, chest tightness and shortness of breath.    Cardiovascular: Negative.  Negative for chest pain and palpitations.   Gastrointestinal: Negative.  Negative for abdominal distention, abdominal pain, blood in stool, diarrhea, nausea and vomiting.   Genitourinary: Negative.  Negative for difficulty urinating, flank pain, frequency and urgency.        No discharge   Musculoskeletal: Negative.  Negative for arthralgias, back pain, myalgias and neck stiffness.   Skin: Positive for wound. Negative for color change and rash.        +head laceration    Neurological: Negative.  Negative for dizziness, seizures, syncope, speech difficulty, weakness, numbness and headaches.   Hematological: Negative for adenopathy.   Psychiatric/Behavioral: Negative.  Negative for agitation, behavioral problems, dysphoric mood and suicidal ideas. The patient is not  nervous/anxious.        Vitals:    12/17/15 1412   BP: (!) 161/95   Pulse: 98   Resp: 18   Temp: 97.6 ??F (36.4 ??C)   SpO2: 95%   Weight: 69.9 kg (154 lb)   Height: 5\' 8"  (1.727 m)            Physical Exam   Constitutional: He is oriented to person, place, and time. He appears well-developed and well-nourished.   In pain   HENT:   Head: Normocephalic.   Mouth/Throat: Oropharynx is clear and moist.   4 cm laceration tot he left eyebrow   Eyes: EOM are normal. Pupils are equal, round, and reactive to light.   Contusion to the left eye   Neck: Normal range of motion. Neck supple.   Cardiovascular: Normal rate, regular rhythm and normal heart sounds.  Exam reveals no gallop and no friction rub.    No murmur heard.  Pulmonary/Chest: Effort normal. No respiratory distress. He has wheezes. He has no rales.   Abdominal: Soft. Bowel sounds are normal. He exhibits no distension. There is no tenderness. There is no rebound and no guarding.    Musculoskeletal: Normal range of motion. He exhibits no edema or tenderness.   Lymphadenopathy:     He has no cervical adenopathy.   Neurological: He is alert and oriented to person, place, and time. He has normal strength. No cranial nerve deficit or sensory deficit. He displays a negative Romberg sign. Coordination and gait normal.   Skin: Skin is warm and dry. No ecchymosis, no lesion and no rash noted. Rash is not urticarial. He is not diaphoretic. No erythema.   Psychiatric: He has a normal mood and affect.   Nursing note and vitals reviewed.       MDM  Number of Diagnoses or Management Options  Diagnosis management comments: DDx: laceration, head injury, eye trauma       Amount and/or Complexity of Data Reviewed  Review and summarize past medical records: yes    Patient Progress  Patient progress: stable    ED Course       Procedures    Procedure Note - Laceration Repair:  3:53 PM  Procedure by Retta Diones, MD.  Complexity: complex  4 cm linear laceration to left eye  was irrigated copiously with NS under jet lavage, prepped with Hibiclens and draped in a sterile fashion.  The area was anesthetized with 4 cc mLs of  Lidocaine 1% without epinephrine via local infiltration.  The wound was explored with the following results: No foreign bodies found.  The wound was repaired with One layer suture closure: Skin Layer:  3 sutures placed, stitch type:simple interrupted, suture: 5-0 nylon..  The wound was closed with good hemostasis and approximation.  Sterile dressing applied.  Estimated blood loss: 3 cc  The procedure took 1-15 minutes, and pt tolerated well.  Written by Isac Caddy, ED Scribe, as dictated by Retta Diones, MD.      MEDICATIONS GIVEN:  Medications   albuterol-ipratropium (DUO-NEB) 2.5 MG-0.5 MG/3 ML (not administered)   lidocaine (XYLOCAINE) 20 mg/mL (2 %) injection 4 mg (4 mg IntraDERMal Given by Provider 12/17/15 1525)   ibuprofen (MOTRIN) tablet 800 mg (800 mg Oral Given 12/17/15 1525)        IMPRESSION:  1. Laceration of face, initial encounter        PLAN:  1.   Current Discharge Medication List  CONTINUE these medications which have NOT CHANGED    Details   cloNIDine HCl (CATAPRES) 0.1 mg tablet Take 1 Tab by mouth two (2) times a day.  Qty: 15 Tab, Refills: 0      budesonide-formoterol (SYMBICORT) 80-4.5 mcg/actuation HFAA inhaler Take 2 Puffs by inhalation two (2) times a day.      pregabalin (LYRICA) 75 mg capsule Take 75 mg by mouth two (2) times a day.      traMADol (ULTRAM) 50 mg tablet Take 50 mg by mouth every six (6) hours as needed for Pain.      DULoxetine (CYMBALTA) 30 mg capsule Take 30 mg by mouth daily.      amLODIPine (NORVASC) 5 mg tablet Take 5 mg by mouth daily.      albuterol (PROVENTIL HFA, VENTOLIN HFA, PROAIR HFA) 90 mcg/actuation inhaler Take 1 Puff by inhalation every four (4) hours as needed for Wheezing (q4-6h prn).      sildenafil citrate (VIAGRA) 100 mg tablet Take 100 mg by mouth as needed.      hydrochlorothiazide (HYDRODIURIL) 25 mg tablet Take 25 mg by mouth daily.      lisinopril (PRINIVIL, ZESTRIL) 20 mg tablet Take 20 mg by mouth daily.      albuterol (PROVENTIL VENTOLIN) 2.5 mg /3 mL (0.083 %) nebulizer solution 2.5 mg by Nebulization route four (4) times daily as needed for Wheezing.           2.   Follow-up Information     Follow up With Details Comments Contact Info    Tommi Rumpsasha Dickerson, MD In 1 week Remove stitches out in 10 days. [address]  [place] [id number]  [phone]          Return to ED if worse     DISCHARGE NOTE:  4:01 PM  The patient is ready for discharge. The patient's signs, symptoms, diagnosis, and discharge instructions have been discussed and the patient has conveyed their understanding. The patient is to follow up as recommended or return to the ER should their symptoms worsen. Plan has been discussed and the patient is in agreement.    This note is prepared by Isac CaddyWilson E Yang, acting as Scribe for Retta DionesNiraj I Yazhini Mcaulay, MD       Retta DionesNiraj I Shaquasha Gerstel, MD: The scribe's documentation has been prepared under my direction and personally reviewed by me in its entirety. I confirm that the note above accurately reflects all work, treatment, procedures, and medical decision making performed by me.

## 2015-12-17 NOTE — ED Notes (Signed)
Gauze roll given to pt and pt hold to laceration-no active drainage once drsg/pressure applied. Derrick, charge rn, notified of pt's complaints.

## 2015-12-17 NOTE — ED Notes (Signed)
Patient (s)  given copy of dc instructions and 0 script(s).  Patient(s)  verbalized understanding of instructions and script (s).  Patient given a current medication reconciliation form and verbalized understanding of their medications.   Patient (s) verbalized understanding of the importance of discussing medications with  his or her physician or clinic when they follow up.  Patient alert and oriented and in no acute distress.  Pt verbalizes pain scale of 0 out of 10.  Patient discharged home ambulatory.  .Marland Kitchen

## 2015-12-17 NOTE — ED Notes (Signed)
Emergency Department Nursing Plan of Care       The Nursing Plan of Care is developed from the Nursing assessment and Emergency Department Attending provider initial evaluation.  The plan of care may be reviewed in the ???ED Provider note???.    The Plan of Care was developed with the following considerations:   Patient / Family readiness to learn indicated YQ:[id number]:verbalized understanding  Persons(s) to be included in education: patient  Barriers to Learning/Limitations:No    Signed     Nicholes RoughStephanie R Barringer, RN    12/17/2015   4:32 PM

## 2015-12-17 NOTE — ED Notes (Signed)
I assisted MD with placement of patient stitches

## 2015-12-27 ENCOUNTER — Inpatient Hospital Stay
Admit: 2015-12-27 | Discharge: 2015-12-27 | Disposition: A | Discharge status: Home / Self Care | Payer: Self-pay | Attending: Emergency Medicine

## 2015-12-27 DIAGNOSIS — Z4802 Encounter for removal of sutures: Secondary | ICD-10-CM

## 2015-12-27 NOTE — ED Notes (Signed)
Emergency Department Nursing Plan of Care       The Nursing Plan of Care is developed from the Nursing assessment and Emergency Department Attending provider initial evaluation.  The plan of care may be reviewed in the ???ED Provider note???.    The Plan of Care was developed with the following considerations:   Patient / Family readiness to learn indicated ZO:XWRUEAVWUJby:verbalized understanding  Persons(s) to be included in education: patient  Barriers to Learning/Limitations:No    Signed     Mirian CapuchinHayley T Buchanan, RN    12/27/2015   12:12 PM

## 2015-12-27 NOTE — ED Provider Notes (Signed)
HPI Comments: Zachary Gallagher is a 60 y.o. male who presents ambulatory to ED for removal of sutures in left eyebrow that were placed last week. He denies any pain.     PCP:  Tommi Rumps, MD    There are no other complaints, changes or physical findings at this time.   Written by Rolan Bucco. Manson Passey, ED Scribe, as dictated by Odis Hollingshead. Dorothey Baseman, MD.    The history is provided by the patient. No language interpreter was used.        Past Medical History:   Diagnosis Date   ??? Chronic obstructive pulmonary disease (HCC)    ??? Hypertension    ??? Ill-defined condition      chronic back pain   ??? Psychiatric disorder      depression, anxiety       No past surgical history on file.      Family History:   Problem Relation Age of Onset   ??? Other       Pt states he does not know of any medical issues in his family       Social History     Social History   ??? Marital status: DIVORCED     Spouse name: N/A   ??? Number of children: N/A   ??? Years of education: N/A     Occupational History   ??? Not on file.     Social History Main Topics   ??? Smoking status: Never Smoker   ??? Smokeless tobacco: Not on file   ??? Alcohol use Yes      Comment: "every now and then"   ??? Drug use: Yes     Special: Marijuana, Cocaine      Comment: tried cocaine last night   ??? Sexual activity: Yes     Partners: Female     Other Topics Concern   ??? Not on file     Social History Narrative    He has applied for disability recently, second application. Longstanding h/o drug dependency. Has a gf of 16 yrs. No job. Did not finish HS. H/o mulitple arrests, including failure to pay child support---outstanding charge at present. Has many friends.          ALLERGIES: Codeine    Review of Systems   Constitutional: Negative for activity change, chills and fever.   HENT: Negative for congestion and sore throat.    Eyes: Negative for pain and redness.   Respiratory: Negative for cough, chest tightness and shortness of breath.     Cardiovascular: Negative for chest pain and palpitations.   Gastrointestinal: Negative for abdominal pain, diarrhea, nausea and vomiting.   Genitourinary: Negative for dysuria, frequency and urgency.   Musculoskeletal: Negative for back pain and neck pain.   Skin: Negative for rash.   Neurological: Negative for syncope, light-headedness and headaches.   Psychiatric/Behavioral: Negative for confusion.   All other systems reviewed and are negative.      Vitals:    12/27/15 1151 12/27/15 1159   BP: (!) 182/107 (!) 171/109   Pulse: 99    Resp: 18    Temp: 97.8 ??F (36.6 ??C)    SpO2: 97%    Weight: 69.9 kg (154 lb)    Height: 5\' 11"  (1.803 m)             Physical Exam   Nursing note and vitals reviewed.   Physical Examination: General appearance - WDWN, in no apparent distress  Head - NC/AT  Eyes - pupils equal, round  and reactive, extraocular eye movements intact, conj/sclera clear, anicteric  Mouth - mucous membranes moist, pharynx normal without lesions  Nose/Ears - nares clear, Tms & canals clear  Neck - supple, no significant adenopathy, trachea midline, no crepitus, c spine diffusely non-tender, no step offs  Chest - Normal respiratory effort, clear to auscultation bilaterally, no wheezes/rales/rhonchi  Heart - normal rate and regular rhythm, S1 and S2 normal, no murmurs, gallops, or rubs  Abdomen - soft, nontender, nondistended, nabs, no masses, guarding, rebound or rigidity  Neurological - alert, oriented, normal speech, cranial nerves intact, no focal motor findings, motor & sensory diffusely intact, normal gait  Extremities/MS - peripheral pulses normal, no pedal edema, all joints atraumatic, FROM, non-tender, no gross deformities, spine diffusely non-tender  Skin - well healing sutures in left eyebrow, normal coloration and turgor, no rashes, no lesions or lacerations      MDM  Number of Diagnoses or Management Options  Visit for suture removal:   Diagnosis management comments: DDx: suture removal        Amount and/or Complexity of Data Reviewed  Review and summarize past medical records: yes    Patient Progress  Patient progress: stable    ED Course       Procedures    Procedure Note - Suture Removal  11:59 AM   Performed by: Odis Hollingshead. Dorothey Baseman, MD  3 suture (s) were removed from left eyebrow. No signs/sxs of infection noted. Wound healing well. Sutures removed without incident or complications.   The procedure took 1-15 minutes, and pt tolerated well.  Written by Rolan Bucco. Manson Passey, ED Scribe, as dictated by Odis Hollingshead. Dorothey Baseman, MD.    IMPRESSION:  1. Visit for suture removal        PLAN:  1.   Current Discharge Medication List      CONTINUE these medications which have NOT CHANGED    Details   cloNIDine HCl (CATAPRES) 0.1 mg tablet Take 1 Tab by mouth two (2) times a day.  Qty: 15 Tab, Refills: 0      budesonide-formoterol (SYMBICORT) 80-4.5 mcg/actuation HFAA inhaler Take 2 Puffs by inhalation two (2) times a day.      pregabalin (LYRICA) 75 mg capsule Take 75 mg by mouth two (2) times a day.      traMADol (ULTRAM) 50 mg tablet Take 50 mg by mouth every six (6) hours as needed for Pain.      DULoxetine (CYMBALTA) 30 mg capsule Take 30 mg by mouth daily.      amLODIPine (NORVASC) 5 mg tablet Take 5 mg by mouth daily.      albuterol (PROVENTIL HFA, VENTOLIN HFA, PROAIR HFA) 90 mcg/actuation inhaler Take 1 Puff by inhalation every four (4) hours as needed for Wheezing (q4-6h prn).      sildenafil citrate (VIAGRA) 100 mg tablet Take 100 mg by mouth as needed.      hydrochlorothiazide (HYDRODIURIL) 25 mg tablet Take 25 mg by mouth daily.      lisinopril (PRINIVIL, ZESTRIL) 20 mg tablet Take 20 mg by mouth daily.      albuterol (PROVENTIL VENTOLIN) 2.5 mg /3 mL (0.083 %) nebulizer solution 2.5 mg by Nebulization route four (4) times daily as needed for Wheezing.           2.   Follow-up Information     Follow up With Details Comments Contact Info     Tommi Rumps, MD  As needed, If symptoms  worsen [address]   [place] [id number]  [phone]          Return to ED if worse       DISCHARGE NOTE  12:00 PM  The patient has been re-evaluated and is ready for discharge. Reviewed available results with patient. Counseled pt on diagnosis and care plan. Pt has expressed understanding, and all questions have been answered. Pt agrees with plan and agrees to follow up as recommended, or return to the ED if their symptoms worsen. Discharge instructions have been provided and explained to the pt, along with reasons to return to the ED.    Attestations:  This note is prepared by Rolan BuccoAlexander H. Manson PasseyBrown, acting as Neurosurgeoncribe for TEPP[employer] PartnersSusangeline S. Dorothey BasemanStrickland, MD.    Vergia AlconSusangeline S. Dorothey BasemanStrickland, MD: The scribe's documentation has been prepared under my direction and personally reviewed by me in its entirety. I confirm that the note above accurately reflects all work, treatment, procedures, and medical decision making performed by me.

## 2015-12-27 NOTE — ED Notes (Signed)
Reviewed discharge instructions and follow up information with patient.  No new medications.  Discharged home via POV.

## 2015-12-27 NOTE — ED Notes (Signed)
Notified provider of BP of 171/109 on recheck. Per Dr. Dorothey BasemanStrickland, ok for discharge.

## 2016-03-22 ENCOUNTER — Emergency Department: Admit: 2016-03-22 | Payer: Self-pay | Primary: Family Medicine

## 2016-03-22 ENCOUNTER — Inpatient Hospital Stay
Admit: 2016-03-22 | Discharge: 2016-03-22 | Disposition: A | Discharge status: Home / Self Care | Payer: Self-pay | Attending: Internal Medicine

## 2016-03-22 DIAGNOSIS — J069 Acute upper respiratory infection, unspecified: Secondary | ICD-10-CM

## 2016-03-22 LAB — CBC WITH AUTOMATED DIFF
ABS. BASOPHILS: 0.1 10*3/uL (ref 0.0–0.1)
ABS. EOSINOPHILS: 0.3 10*3/uL (ref 0.0–0.4)
ABS. LYMPHOCYTES: 2.3 10*3/uL (ref 0.8–3.5)
ABS. MONOCYTES: 0.5 10*3/uL (ref 0.0–1.0)
ABS. NEUTROPHILS: 2.8 10*3/uL (ref 1.8–8.0)
BASOPHILS: 1 % (ref 0–1)
EOSINOPHILS: 5 % (ref 0–7)
HCT: 44.6 % (ref 36.6–50.3)
HGB: 15.4 g/dL (ref 12.1–17.0)
LYMPHOCYTES: 38 % (ref 12–49)
MCH: 29.6 PG (ref 26.0–34.0)
MCHC: 34.5 g/dL (ref 30.0–36.5)
MCV: 85.8 FL (ref 80.0–99.0)
MONOCYTES: 8 % (ref 5–13)
NEUTROPHILS: 48 % (ref 32–75)
PLATELET: 253 10*3/uL (ref 150–400)
RBC: 5.2 M/uL (ref 4.10–5.70)
RDW: 14.2 % (ref 11.5–14.5)
WBC: 6 10*3/uL (ref 4.1–11.1)

## 2016-03-22 LAB — METABOLIC PANEL, BASIC
Anion gap: 9 mmol/L (ref 5–15)
BUN/Creatinine ratio: 10 — ABNORMAL LOW (ref 12–20)
BUN: 10 MG/DL (ref 6–20)
CO2: 27 mmol/L (ref 21–32)
Calcium: 8.6 MG/DL (ref 8.5–10.1)
Chloride: 105 mmol/L (ref 97–108)
Creatinine: 0.97 MG/DL (ref 0.70–1.30)
GFR est AA: >60 mL/min/{1.73_m2} (ref >60)
GFR est non-AA: >60 mL/min/{1.73_m2} (ref >60)
Glucose: 112 mg/dL — ABNORMAL HIGH (ref 65–100)
Potassium: 3.9 mmol/L (ref 3.5–5.1)
Sodium: 141 mmol/L (ref 136–145)

## 2016-03-22 LAB — CK W/ CKMB & INDEX
CK - MB: <1 NG/ML (ref <3.6)
CK: 219 U/L (ref 39–308)

## 2016-03-22 LAB — TROPONIN I: Troponin-I, Qt.: <0.04 ng/mL (ref <0.05)

## 2016-03-22 MED ORDER — ALBUTEROL SULFATE 0.083 % (0.83 MG/ML) SOLN FOR INHALATION
2.5 mg /3 mL (0.083 %) | RESPIRATORY_TRACT | Status: AC
Start: 2016-03-22 — End: 2016-03-22
  Administered 2016-03-22: 11:00:00 via RESPIRATORY_TRACT

## 2016-03-22 MED ORDER — ALBUTEROL SULFATE HFA 90 MCG/ACTUATION AEROSOL INHALER
90 mcg/actuation | RESPIRATORY_TRACT | 1 refills | Status: AC | PRN
Start: 2016-03-22 — End: ?

## 2016-03-22 MED ORDER — SODIUM CHLORIDE 0.9 % IJ SYRG
INTRAMUSCULAR | Status: DC | PRN
Start: 2016-03-22 — End: 2016-03-22

## 2016-03-22 MED ORDER — DOXYCYCLINE HYCLATE 100 MG TAB
100 mg | ORAL_TABLET | Freq: Two times a day (BID) | ORAL | 0 refills | Status: AC
Start: 2016-03-22 — End: 2016-04-01

## 2016-03-22 MED ORDER — PREDNISONE 20 MG TAB
20 mg | ORAL | Status: AC
Start: 2016-03-22 — End: 2016-03-22
  Administered 2016-03-22: 12:00:00 via ORAL

## 2016-03-22 MED ORDER — IPRATROPIUM-ALBUTEROL 2.5 MG-0.5 MG/3 ML NEB SOLUTION
2.5 mg-0.5 mg/3 ml | RESPIRATORY_TRACT | Status: AC
Start: 2016-03-22 — End: 2016-03-22
  Administered 2016-03-22: 11:00:00 via RESPIRATORY_TRACT

## 2016-03-22 MED ORDER — ALBUTEROL SULFATE 0.083 % (0.83 MG/ML) SOLN FOR INHALATION
2.5 mg /3 mL (0.083 %) | Freq: Four times a day (QID) | RESPIRATORY_TRACT | 0 refills | Status: AC | PRN
Start: 2016-03-22 — End: ?

## 2016-03-22 MED ORDER — ALBUTEROL SULFATE 0.083 % (0.83 MG/ML) SOLN FOR INHALATION
2.5 mg /3 mL (0.083 %) | RESPIRATORY_TRACT | Status: DC
Start: 2016-03-22 — End: 2016-03-22

## 2016-03-22 MED ORDER — PREDNISONE 20 MG TAB
20 mg | ORAL_TABLET | Freq: Every day | ORAL | 0 refills | Status: AC
Start: 2016-03-22 — End: 2016-03-29

## 2016-03-22 MED ORDER — LORATADINE 10 MG TAB
10 mg | Freq: Once | ORAL | Status: AC
Start: 2016-03-22 — End: 2016-03-22
  Administered 2016-03-22: 12:00:00 via ORAL

## 2016-03-22 MED ORDER — PROMETHAZINE 6.25 MG/5 ML SYRUP
6.25 mg/5 mL | Freq: Four times a day (QID) | ORAL | 0 refills | Status: AC | PRN
Start: 2016-03-22 — End: 2016-03-29

## 2016-03-22 MED ORDER — SODIUM CHLORIDE 0.9 % IJ SYRG
Freq: Three times a day (TID) | INTRAMUSCULAR | Status: DC
Start: 2016-03-22 — End: 2016-03-22

## 2016-03-22 MED FILL — ALBUTEROL SULFATE 0.083 % (0.83 MG/ML) SOLN FOR INHALATION: 2.5 mg /3 mL (0.083 %) | RESPIRATORY_TRACT | Qty: 1

## 2016-03-22 MED FILL — LORATADINE 10 MG TAB: 10 mg | ORAL | Qty: 1

## 2016-03-22 MED FILL — PREDNISONE 20 MG TAB: 20 mg | ORAL | Qty: 3

## 2016-03-22 MED FILL — IPRATROPIUM-ALBUTEROL 2.5 MG-0.5 MG/3 ML NEB SOLUTION: 2.5 mg-0.5 mg/3 ml | RESPIRATORY_TRACT | Qty: 3

## 2016-03-22 MED FILL — BD POSIFLUSH NORMAL SALINE 0.9 % INJECTION SYRINGE: INTRAMUSCULAR | Qty: 10

## 2016-03-22 NOTE — ED Notes (Signed)
Patient given crackers and water at this time.  Patient talking on phone to his brother.

## 2016-03-22 NOTE — ED Provider Notes (Signed)
HPI Comments: Zachary Gallagher is a 60 y.o. Male cocaine and marijuana user who has a h/o COPD, hypertension and anxiety presents to [facility] ED ambulatory with cc worsening shortness of breath x 2 weeks. The patient also reports a productive "Karman Veney-yellow" sputum cough x 2 weeks. Th patient states that in the past he has had access to an inhaler and a nebulizer machine, however he states that he has not been able to use either intervention in recent weeks. The patient denies all other symptoms at this time, including any nausea, vomiting, fevers or chills.    PCP: Tommi Rumps, MD    There are no other complaints changes or physical findings at this time  Written by Beatriz Chancellor, ED Scribe as dictated by Levora Angel, MD.    The history is provided by the patient.        Past Medical History:   Diagnosis Date   ??? Chronic obstructive pulmonary disease (HCC)    ??? Hypertension    ??? Ill-defined condition     chronic back pain   ??? Psychiatric disorder     depression, anxiety       History reviewed. No pertinent surgical history.      Family History:   Problem Relation Age of Onset   ??? Other Other      Pt states he does not know of any medical issues in his family       Social History     Social History   ??? Marital status: DIVORCED     Spouse name: N/A   ??? Number of children: N/A   ??? Years of education: N/A     Occupational History   ??? Not on file.     Social History Main Topics   ??? Smoking status: Never Smoker   ??? Smokeless tobacco: Not on file   ??? Alcohol use Yes      Comment: "every now and then"   ??? Drug use: Yes     Special: Marijuana, Cocaine      Comment: tried cocaine last night   ??? Sexual activity: Yes     Partners: Female     Other Topics Concern   ??? Not on file     Social History Narrative    He has applied for disability recently, second application. Longstanding h/o drug dependency. Has a gf of 16 yrs. No job. Did not finish HS. H/o mulitple arrests, including failure to pay child support---outstanding  charge at present. Has many friends.          ALLERGIES: Codeine    Review of Systems   Constitutional: Negative.  Negative for chills, diaphoresis and fever.   HENT: Negative.  Negative for congestion, ear pain, sore throat and trouble swallowing.    Eyes: Negative.  Negative for photophobia, pain, redness and visual disturbance.   Respiratory: Positive for cough and shortness of breath. Negative for chest tightness and wheezing.    Cardiovascular: Negative.  Negative for chest pain and palpitations.   Gastrointestinal: Negative.  Negative for abdominal pain, blood in stool, diarrhea, nausea and vomiting.   Genitourinary: Negative for dysuria and frequency.   Musculoskeletal: Negative.  Negative for back pain, joint swelling and neck pain.   Skin: Negative.    Neurological: Negative.  Negative for seizures, syncope and headaches.   Psychiatric/Behavioral: Negative.  Negative for behavioral problems and confusion. The patient is not nervous/anxious.        Vitals:  03/22/16 0636   BP: 128/88   Pulse: 99   Resp: 18   Temp: 97.7 ??F (36.5 ??C)   SpO2: 98%   Weight: 71.7 kg (158 lb)   Height: 5\' 11"  (1.803 m)            Physical Exam   Constitutional: He is oriented to person, place, and time. He appears well-developed and well-nourished. No distress.   HENT:   Head: Normocephalic and atraumatic.   Mouth/Throat: Oropharynx is clear and moist.   Eyes: Conjunctivae and EOM are normal. Pupils are equal, round, and reactive to light. No scleral icterus.   Neck: Normal range of motion. Neck supple.   Cardiovascular: Normal rate and regular rhythm.  Exam reveals no gallop.    No murmur heard.  Pulmonary/Chest: No stridor. He is in respiratory distress (Moderate). He has decreased breath sounds. He has wheezes (Diffusely). He has no rales.   Abdominal: Soft. Bowel sounds are normal. He exhibits no distension and no mass. There is no tenderness. There is no rebound and no guarding.    Musculoskeletal: Normal range of motion. He exhibits no edema.   Lymphadenopathy:     He has no cervical adenopathy.   Neurological: He is alert and oriented to person, place, and time. Coordination normal.   CN 3-12 are normal   Skin: Skin is warm and dry. No rash noted. No erythema.   Psychiatric: He has a normal mood and affect.   Nursing note and vitals reviewed.       MDM  Number of Diagnoses or Management Options  Diagnosis management comments: Ddx: COPD exacerbation, MI, pneumonia       Amount and/or Complexity of Data Reviewed  Clinical lab tests: ordered and reviewed  Tests in the radiology section of CPT??: ordered and reviewed  Tests in the medicine section of CPT??: ordered and reviewed  Review and summarize past medical records: yes  Independent visualization of images, tracings, or specimens: yes    Patient Progress  Patient progress: stable    ED Course       Procedures     SIGN OUT:  7:06 AM  Patient's presentation, labs/imaging and plan of care was reviewed with Bradd Burneramara M Adesuwa Osgood, MD as part of sign out.  They will review lab work as it comes available as part of the plan discussed with the patient.    Bradd Burneramara M Rodel Glaspy, MD's assistance in completion of this plan is greatly appreciated but it should be noted that I will be the provider of record for this patient.    Levora AngelEvan P Kardon, MD    This note is prepared by Beatriz ChancellorGregory Robinson II acting as Scribe for Levora AngelEvan P Kardon, MD.    Levora AngelEvan P Kardon, MD: The Scribe's documentation has been prepared under my direction and personally reviewed by me in its entirety. I confirm that the note above accurately reflects all work, treatment, procedures, and medical decision making performed by me.    Progress Note  7:16 AM    Bradd Burneramara M Chrishonda Hesch, MD has re-evaluated pt, and pt still has scattered rhonchi, but is speaking in full sentences.  Written by Rolan BuccoAlexander H. Manson PasseyBrown, ED Scribe, as dictated by Bradd Burneramara M Ayaansh Smail, MD.       LABORATORY TESTS:  Recent Results (from the past 12 hour(s))    EKG, 12 LEAD, INITIAL    Collection Time: 03/22/16  6:48 AM   Result Value Ref Range    Ventricular Rate 81 BPM  Atrial Rate 81 BPM    P-R Interval 130 ms    QRS Duration 82 ms    Q-T Interval 376 ms    QTC Calculation (Bezet) 436 ms    Calculated P Axis 79 degrees    Calculated T Axis 55 degrees    Diagnosis       Normal sinus rhythm  Septal infarct , age undetermined  Abnormal ECG  When compared with ECG of 19-Jan-2015 17:16,  Septal infarct is now present     CBC WITH AUTOMATED DIFF    Collection Time: 03/22/16  6:58 AM   Result Value Ref Range    WBC 6.0 4.1 - 11.1 K/uL    RBC 5.20 4.10 - 5.70 M/uL    HGB 15.4 12.1 - 17.0 g/dL    HCT 16.1 09.6 - 04.5 %    MCV 85.8 80.0 - 99.0 FL    MCH 29.6 26.0 - 34.0 PG    MCHC 34.5 30.0 - 36.5 g/dL    RDW 40.9 81.1 - 91.4 %    PLATELET 253 150 - 400 K/uL    NEUTROPHILS 48 32 - 75 %    LYMPHOCYTES 38 12 - 49 %    MONOCYTES 8 5 - 13 %    EOSINOPHILS 5 0 - 7 %    BASOPHILS 1 0 - 1 %    ABS. NEUTROPHILS 2.8 1.8 - 8.0 K/UL    ABS. LYMPHOCYTES 2.3 0.8 - 3.5 K/UL    ABS. MONOCYTES 0.5 0.0 - 1.0 K/UL    ABS. EOSINOPHILS 0.3 0.0 - 0.4 K/UL    ABS. BASOPHILS 0.1 0.0 - 0.1 K/UL    DF SMEAR SCANNED      RBC COMMENTS ANISOCYTOSIS  1+       METABOLIC PANEL, BASIC    Collection Time: 03/22/16  6:58 AM   Result Value Ref Range    Sodium 141 136 - 145 mmol/L    Potassium 3.9 3.5 - 5.1 mmol/L    Chloride 105 97 - 108 mmol/L    CO2 27 21 - 32 mmol/L    Anion gap 9 5 - 15 mmol/L    Glucose 112 (H) 65 - 100 mg/dL    BUN 10 6 - 20 MG/DL    Creatinine 7.82 9.56 - 1.30 MG/DL    BUN/Creatinine ratio 10 (L) 12 - 20      GFR est AA >60 >60 ml/min/1.74m2    GFR est non-AA >60 >60 ml/min/1.70m2    Calcium 8.6 8.5 - 10.1 MG/DL   TROPONIN I    Collection Time: 03/22/16  6:58 AM   Result Value Ref Range    Troponin-I, Qt. <0.04 <0.05 ng/mL   CK W/ CKMB & INDEX    Collection Time: 03/22/16  6:58 AM   Result Value Ref Range    CK 219 39 - 308 U/L    CK - MB <1.0 <3.6 NG/ML     CK-MB Index Cannot be calulated 0.0 - 2.5         IMAGING RESULTS:  CXR Results  (Last 48 hours)               03/22/16 0658  XR CHEST PORT Final result    Impression:  Impression:   No acute cardiopulmonary process.       Narrative:  Chest portable AP       History: Chest pain. Shortness of breath.       Comparison: 05/31/2012  Findings:  The lungs are well expanded.  No focal consolidation, pleural   effusion, or pneumothorax.  The heart is normal size. The aorta is tortuous. The   visualized osseous structures are unremarkable.                 MEDICATIONS GIVEN:  Medications   sodium chloride (NS) flush 5-10 mL (not administered)   sodium chloride (NS) flush 5-10 mL (not administered)   albuterol (PROVENTIL VENTOLIN) nebulizer solution 2.5 mg (not administered)   albuterol-ipratropium (DUO-NEB) 2.5 MG-0.5 MG/3 ML (3 mL Nebulization Given 03/22/16 0641)   albuterol (PROVENTIL VENTOLIN) nebulizer solution 2.5 mg (2.5 mg Nebulization Given 03/22/16 0652)   albuterol (PROVENTIL VENTOLIN) nebulizer solution 2.5 mg (2.5 mg Nebulization Given 03/22/16 0651)   predniSONE (DELTASONE) tablet 60 mg (60 mg Oral Given 03/22/16 0734)   loratadine (CLARITIN) tablet 10 mg (10 mg Oral Given 03/22/16 0734)       IMPRESSION:  1. Acute upper respiratory infection    2. Acute bronchitis, unspecified organism        PLAN:  1.   Current Discharge Medication List      START taking these medications    Details   doxycycline (VIBRA-TABS) 100 mg tablet Take 1 Tab by mouth two (2) times a day for 10 days.  Qty: 20 Tab, Refills: 0      predniSONE (DELTASONE) 20 mg tablet Take 1 Tab by mouth daily for 7 days. With Breakfast  Qty: 7 Tab, Refills: 0      !! albuterol (PROVENTIL HFA, VENTOLIN HFA, PROAIR HFA) 90 mcg/actuation inhaler Take 2 Puffs by inhalation every four (4) hours as needed for Wheezing.  Qty: 1 Inhaler, Refills: 1      promethazine (PHENERGAN) 6.25 mg/5 mL syrup Take 5 mL by mouth four (4)  times daily as needed for Nausea (congestion, cough) for up to 7 days.  Qty: 80 mL, Refills: 0       !! - Potential duplicate medications found. Please discuss with provider.      CONTINUE these medications which have CHANGED    Details   albuterol (PROVENTIL VENTOLIN) 2.5 mg /3 mL (0.083 %) nebulizer solution 3 mL by Nebulization route four (4) times daily as needed for Wheezing.  Qty: 24 Each, Refills: 0         CONTINUE these medications which have NOT CHANGED    Details   !! albuterol (PROVENTIL HFA, VENTOLIN HFA, PROAIR HFA) 90 mcg/actuation inhaler Take 1 Puff by inhalation every four (4) hours as needed for Wheezing (q4-6h prn).       !! - Potential duplicate medications found. Please discuss with provider.        2.   Follow-up Information     Follow up With Details Comments Contact Info    [facility] EMERGENCY DEPT  If symptoms worsen [address] [place] [address]  [phone]    Tommi Rumps, MD Schedule an appointment as soon as possible for a visit If symptoms worsen [address] [place] [address]  [phone]          Return to ED if worse       Discharge Note:  8:18 AM  The patient is ready for discharge. The patient's signs, symptoms, diagnosis, and discharge instructions have been discussed and the patient has conveyed their understanding. The patient is to follow up as recommended or return to the ER should their symptoms worsen. Plan has been  discussed and the patient is in agreement.    Attestations:  This note is prepared by Rolan Bucco. Manson Passey, acting as Scribe for Bradd Burner, MD.    Bradd Burner, MD: The scribe's documentation has been prepared under my direction and personally reviewed by me in its entirety. I confirm that the note above accurately reflects all work, treatment, procedures, and medical decision making performed by me.

## 2016-03-22 NOTE — ED Notes (Signed)
Provider at bedside.

## 2016-03-22 NOTE — ED Notes (Addendum)
Bedside shift change report given to A. White, RN (oncoming nurse) by T. Soyars, RN (offgoing nurse). Report included the following information SBAR.

## 2016-03-22 NOTE — ED Notes (Signed)
Unable to obtain blood from IV site.

## 2016-03-22 NOTE — ED Notes (Signed)
Patient given copy of dc instructions and 4 paper script(s) and 0 electronic scripts.  Patient  verbalized understanding of instructions and script (s).  Patient given a current medication reconciliation form and verbalized understanding of their medications.   Patient  verbalized understanding of the importance of discussing medications with  his or her physician or clinic they will be following up with.  Patient alert and oriented and in no acute distress.  Patient offered wheelchair from treatment area to hospital entrance, patient declines wheelchair.

## 2016-03-22 NOTE — ED Notes (Addendum)
Patient given meal tray per request.  Patient was given urinal but states unable to pee.  Still on telephone at this time.

## 2016-03-23 LAB — EKG, 12 LEAD, INITIAL
Atrial Rate: 81 {beats}/min
Calculated P Axis: 79 degrees
Calculated T Axis: 55 degrees
Diagnosis: NORMAL
P-R Interval: 130 ms
Q-T Interval: 376 ms
QRS Duration: 82 ms
QTC Calculation (Bezet): 436 ms
Ventricular Rate: 81 {beats}/min

## 2017-07-21 ENCOUNTER — Inpatient Hospital Stay
Admit: 2017-07-21 | Discharge: 2017-07-21 | Disposition: A | Discharge status: Home / Self Care | Payer: Self-pay | Attending: Emergency Medicine

## 2017-07-21 DIAGNOSIS — G8929 Other chronic pain: Secondary | ICD-10-CM

## 2017-07-21 MED ORDER — CYCLOBENZAPRINE 5 MG TAB
5 mg | ORAL_TABLET | Freq: Three times a day (TID) | ORAL | 0 refills | Status: AC | PRN
Start: 2017-07-21 — End: ?

## 2017-07-21 MED ORDER — IBUPROFEN 400 MG TAB
400 mg | ORAL_TABLET | Freq: Three times a day (TID) | ORAL | 0 refills | Status: AC | PRN
Start: 2017-07-21 — End: ?

## 2017-07-21 NOTE — ED Notes (Signed)
Patient verbalizes understanding of discharge instructions. Opportunity for questions provided, VSS. Patient in no apparent distress. Patient ambulatory upon discharge.

## 2017-07-21 NOTE — ED Provider Notes (Addendum)
HPI Comments: Zachary Gallagher is a 61 y.o. male with PMH of HTN, depression, anxiety, COPD presents to emergency room via EMS for evaluation of acute flare of chronic low back pain. States he "lives" at a 9/10 on the pain scale. He states while in church today he began with intermittent "shooting" pains from the low back down to his posterior thigh that have since resolved. He states his left leg gave out but he caught himself and did not actually hit anything. He denies head injury, F/C, N/V/D, CP, SOB, urinary sx's, dysuria, frequency, urgency or hematuria.    PCP: Tommi Rumpsasha Dickerson, MD    Surgical hx- none  Social hx- no tobacco; + marijuana    The patient has no other complaints at this time.      Patient is a 61 y.o. male presenting with back pain. The history is provided by the patient.   Back Pain    Pertinent negatives include no chest pain, no numbness, no headaches, no abdominal pain and no dysuria.        Past Medical History:   Diagnosis Date   ??? Chronic obstructive pulmonary disease (HCC)    ??? Hypertension    ??? Ill-defined condition     chronic back pain   ??? Psychiatric disorder     depression, anxiety       No past surgical history on file.      Family History:   Problem Relation Age of Onset   ??? Other Other      Pt states he does not know of any medical issues in his family       Social History     Social History   ??? Marital status: DIVORCED     Spouse name: N/A   ??? Number of children: N/A   ??? Years of education: N/A     Occupational History   ??? Not on file.     Social History Main Topics   ??? Smoking status: Never Smoker   ??? Smokeless tobacco: Not on file   ??? Alcohol use Yes      Comment: "every now and then"   ??? Drug use: Yes     Special: Marijuana, Cocaine      Comment: tried cocaine last night   ??? Sexual activity: Yes     Partners: Female     Other Topics Concern   ??? Not on file     Social History Narrative    He has applied for disability recently, second application. Longstanding  h/o drug dependency. Has a gf of 16 yrs. No job. Did not finish HS. H/o mulitple arrests, including failure to pay child support---outstanding charge at present. Has many friends.          ALLERGIES: Codeine    Review of Systems   Constitutional: Negative.  Negative for activity change, chills, fatigue and unexpected weight change.   Respiratory: Negative for cough, chest tightness, shortness of breath and wheezing.    Cardiovascular: Negative.  Negative for chest pain and palpitations.   Gastrointestinal: Negative.  Negative for abdominal pain, diarrhea, nausea and vomiting.   Genitourinary: Negative.  Negative for dysuria, flank pain, frequency and hematuria.   Musculoskeletal: Positive for back pain. Negative for arthralgias, neck pain and neck stiffness.   Skin: Negative.  Negative for color change and rash.   Neurological: Negative.  Negative for dizziness, numbness and headaches.   Psychiatric/Behavioral: Negative.  Negative for confusion.   All other systems  reviewed and are negative.      Vitals:    07/21/17 1642   BP: (!) 146/102   Pulse: 81   Resp: 16   Temp: 97.5 ??F (36.4 ??C)   SpO2: 99%   Weight: 77.5 kg (170 lb 13.7 oz)   Height: 5\' 11"  (1.803 m)            Physical Exam   Constitutional: He is oriented to person, place, and time. He appears well-developed and well-nourished. No distress.   HENT:   Head: Normocephalic and atraumatic.   Eyes: EOM are normal. Pupils are equal, round, and reactive to light.   Neck: Normal range of motion. Neck supple.   Cardiovascular: Normal rate and intact distal pulses.    Pulmonary/Chest: Effort normal and breath sounds normal. No respiratory distress. He exhibits no tenderness.   Abdominal: Soft. Bowel sounds are normal. He exhibits no distension.   Musculoskeletal: Normal range of motion. He exhibits no edema or tenderness.        Back:    Ambulates without problem.   Neurological: He is alert and oriented to person, place, and time. He  exhibits normal muscle tone. Coordination normal.   Skin: Skin is warm and dry. He is not diaphoretic. No pallor.   Psychiatric: He has a normal mood and affect. His behavior is normal.   Nursing note and vitals reviewed.       MDM  Number of Diagnoses or Management Options  Acute exacerbation of chronic low back pain:   Diagnosis management comments:   Ddx: sprain, DJD, HNP, muscle spasm       Amount and/or Complexity of Data Reviewed  Review and summarize past medical records: yes    Patient Progress  Patient progress: stable        ED Course       Procedures      Patient states hes had X-rays of his L-spine in the past 2 years, denies trauma recently, sx's are improved. Declines offer for lidocaine patches, states he's seen "so many doctors" for this problem and is requesting narcotics. I advised he does not require narcotics for his chronic condition. He states "oh well I figured I'd ask". He is walking around the department without problem.  Ezekiel Slocumb, PA-C      DISCHARGE NOTE:  5:57 PM  The patient's results have been reviewed with them and/or available family. Patient and/or family verbally conveyed their understanding and agreement of the patient's signs, symptoms, diagnosis, treatment and prognosis and additionally agree to follow up as recommended in the discharge instructions or to return to the Emergency Room should their condition change prior to their follow-up appointment. The patient/family verbally agrees with the care-plan and verbally conveys that all of their questions have been answered. The discharge instructions have also been provided to the patient and/or family with some educational information regarding the patient's diagnosis as well a list of reasons why the patient would want to return to the ER prior to their follow-up appointment, should their condition change.      Plan:  1. F/U with PCP  2. Rx ibuprofen, flexeril  3. Pain management information given as well   Return precautions discussed and advised to return to ER if worse

## 2017-07-21 NOTE — ED Triage Notes (Signed)
Pt states that while at church he felt like he had many bees stinging his entire left leg and then switched over to his right leg. Pt states that he has been having this issue for the past 3 month and states that he feels it is related to his back issues.

## 2018-08-23 ENCOUNTER — Other Ambulatory Visit: Payer: Self-pay

## 2018-08-23 ENCOUNTER — Emergency Department (HOSPITAL_COMMUNITY)
Admission: EM | Admit: 2018-08-23 | Discharge: 2018-08-23 | Disposition: A | Discharge status: Home / Self Care | Payer: Self-pay | Attending: Emergency Medicine | Admitting: Emergency Medicine

## 2018-08-23 ENCOUNTER — Encounter (HOSPITAL_COMMUNITY): Payer: Self-pay

## 2018-08-23 DIAGNOSIS — I10 Essential (primary) hypertension: Secondary | ICD-10-CM | POA: Insufficient documentation

## 2018-08-23 DIAGNOSIS — K047 Periapical abscess without sinus: Secondary | ICD-10-CM | POA: Insufficient documentation

## 2018-08-23 DIAGNOSIS — J449 Chronic obstructive pulmonary disease, unspecified: Secondary | ICD-10-CM | POA: Insufficient documentation

## 2018-08-23 DIAGNOSIS — K0889 Other specified disorders of teeth and supporting structures: Secondary | ICD-10-CM | POA: Insufficient documentation

## 2018-08-23 DIAGNOSIS — F17210 Nicotine dependence, cigarettes, uncomplicated: Secondary | ICD-10-CM | POA: Insufficient documentation

## 2018-08-23 DIAGNOSIS — R600 Localized edema: Secondary | ICD-10-CM | POA: Insufficient documentation

## 2018-08-23 HISTORY — DX: Essential (primary) hypertension: I10

## 2018-08-23 HISTORY — DX: Depression, unspecified: F32.A

## 2018-08-23 HISTORY — DX: Major depressive disorder, single episode, unspecified: F32.9

## 2018-08-23 HISTORY — DX: Chronic obstructive pulmonary disease, unspecified: J44.9

## 2018-08-23 MED ORDER — CLINDAMYCIN HCL 150 MG PO CAPS: 150.0000 mg | ORAL_CAPSULE | Freq: Four times a day (QID) | ORAL | 0 refills | Status: DC

## 2018-08-23 MED ORDER — CLINDAMYCIN HCL 300 MG PO CAPS
300.0000 mg | ORAL_CAPSULE | Freq: Once | ORAL | Status: AC
  Administered 2018-08-23: 300 mg via ORAL
  Filled 2018-08-23: qty 1

## 2018-08-23 MED ORDER — IBUPROFEN 600 MG PO TABS: 600.0000 mg | ORAL_TABLET | Freq: Four times a day (QID) | ORAL | 0 refills | Status: DC | PRN

## 2018-08-23 NOTE — ED Provider Notes (Signed)
[facility] DEPT Provider Note   CSN: [id number] Arrival date & time: 08/23/18  1137     History   Chief Complaint Chief Complaint  Patient presents with  . Shortness of Breath  . Oral Swelling  . Dental Pain    HPI Devin Myers is a 62 y.o. male.  The history is provided by the patient and a relative. No language interpreter was used.  Shortness of Breath  Pertinent negatives include no fever.  Dental Pain       62 year old male with history of hypertension, COPD, depression presenting for evaluation of a facial swelling patient report for the past 2 to 3 days he has had persistent dental pain to the front of his top tooth.  Pain is sharp throbbing and achy and since yesterday it has spread throughout the frontal aspect of his face with associated facial swelling.  Pain is moderate in severity, worsening with chewing and with temperature change.  He now endorsed headache, pain when he pushed on his nose, when the pain is intense, he does endorse shortness of breath..  He tries taking ibuprofen and mouth rinse without relief.  He denies associated fever, chills, lightheadedness, dizziness, chest pain, trouble swallowing.  He denies any recent injury.  He is on lisinopril as a blood pressure medication.  He denies any tongue swelling.  He does use marijuana to help with pain.  Past Medical History:  Diagnosis Date  . COPD (chronic obstructive pulmonary disease) ([place])   . Depression   . Hypertension     There are no active problems to display for this patient.   History reviewed. No pertinent surgical history.      Home Medications    Prior to Admission medications   Not on File    Family History History reviewed. No pertinent family history.  Social History Social History   Tobacco Use  . Smoking status: Current Some Day Smoker    Types: Cigarettes  . Smokeless tobacco: Never Used  Substance Use Topics  . Alcohol use: Yes   Comment: occasionally  . Drug use: Yes    Types: Marijuana     Allergies   Patient has no known allergies.   Review of Systems Review of Systems  Constitutional: Negative for fever.  HENT: Positive for dental problem and facial swelling. Negative for trouble swallowing.   Respiratory: Positive for shortness of breath.   All other systems reviewed and are negative.    Physical Exam Updated Vital Signs BP (!) 132/91 (BP Location: Right Arm)   Pulse 99   Temp 99 F (37.2 C) (Oral)   Resp 17   Ht 5\' 11"  (1.803 m)   Wt 70.3 kg   SpO2 99%   BMI 21.62 kg/m   Physical Exam  Constitutional: He appears well-developed and well-nourished. No distress.  Patient resting comfortably in no acute discomfort.  HENT:  Head: Atraumatic.  Mouth: Obvious dental decay noted to tooth #6 with tenderness to palpation.  Adjacent gingival erythema noted without palpable abscess.  Surrounding soft facial swelling involving the frontal maxillary region, tender to palpation.  No lip edema, no tongue swelling.  Normal nares.  Eyes: Conjunctivae are normal.  Neck: Normal range of motion. Neck supple.  Cardiovascular: Normal rate and regular rhythm.  Pulmonary/Chest: Effort normal and breath sounds normal. No accessory muscle usage. No respiratory distress.  Neurological: He is alert.  Skin: No rash noted.  Psychiatric: He has a normal mood and affect.  Nursing note and vitals reviewed.    ED Treatments / Results  Labs (all labs ordered are listed, but only abnormal results are displayed) Labs Reviewed - No data to display  EKG None  Radiology No results found.  Procedures Procedures (including critical care time)  Medications Ordered in ED Medications - No data to display   Initial Impression / Assessment and Plan / ED Course  I have reviewed the triage vital signs and the nursing notes.  Pertinent labs & imaging results that were available during my care of the patient were  reviewed by me and considered in my medical decision making (see chart for details).     BP (!) 132/91 (BP Location: Right Arm)   Pulse 99   Temp 99 F (37.2 C) (Oral)   Resp 17   Ht 5\' 11"  (1.803 m)   Wt 70.3 kg   SpO2 99%   BMI 21.62 kg/m    Final Clinical Impressions(s) / ED Diagnoses   Final diagnoses:  Periapical abscess with facial involvement    ED Discharge Orders         Ordered    clindamycin (CLEOCIN) 150 MG capsule  Every 6 hours     08/23/18 1356    ibuprofen (ADVIL,MOTRIN) 600 MG tablet  Every 6 hours PRN     08/23/18 1356         1:14 PM Patient here with dental pain and facial swelling consistent with periapical abscess with facial involvement.  This is not consistent with angioedema secondary to lisinopril.  Area is tender to palpation.  No obvious abscess amenable for drainage in the ED.  Normal phonation, patient is well-appearing.  He will be treated with antibiotic, antifungal medication, and outpatient follow-up with dentist for further care.  Return precautions discussed.   Domenic Moras, PA-C 08/23/18 1357    Maudie Flakes, MD 08/23/18 [phone]

## 2018-08-23 NOTE — ED Triage Notes (Signed)
Patient c/o lip swelling, headache, and SOB since 0500 today. Patient does take Lisinopril.

## 2018-10-04 ENCOUNTER — Emergency Department (HOSPITAL_COMMUNITY): Payer: No Typology Code available for payment source

## 2018-10-04 ENCOUNTER — Encounter (HOSPITAL_COMMUNITY): Payer: Self-pay

## 2018-10-04 ENCOUNTER — Other Ambulatory Visit: Payer: Self-pay

## 2018-10-04 ENCOUNTER — Emergency Department (HOSPITAL_COMMUNITY)
Admission: EM | Admit: 2018-10-04 | Discharge: 2018-10-04 | Disposition: A | Discharge status: Home / Self Care | Payer: No Typology Code available for payment source | Attending: Emergency Medicine | Admitting: Emergency Medicine

## 2018-10-04 DIAGNOSIS — J449 Chronic obstructive pulmonary disease, unspecified: Secondary | ICD-10-CM | POA: Diagnosis not present

## 2018-10-04 DIAGNOSIS — H538 Other visual disturbances: Secondary | ICD-10-CM | POA: Diagnosis not present

## 2018-10-04 DIAGNOSIS — R112 Nausea with vomiting, unspecified: Secondary | ICD-10-CM | POA: Insufficient documentation

## 2018-10-04 DIAGNOSIS — R51 Headache: Secondary | ICD-10-CM | POA: Insufficient documentation

## 2018-10-04 DIAGNOSIS — I10 Essential (primary) hypertension: Secondary | ICD-10-CM | POA: Insufficient documentation

## 2018-10-04 DIAGNOSIS — R42 Dizziness and giddiness: Secondary | ICD-10-CM | POA: Diagnosis present

## 2018-10-04 DIAGNOSIS — F17210 Nicotine dependence, cigarettes, uncomplicated: Secondary | ICD-10-CM | POA: Insufficient documentation

## 2018-10-04 DIAGNOSIS — R519 Headache, unspecified: Secondary | ICD-10-CM

## 2018-10-04 DIAGNOSIS — Z79899 Other long term (current) drug therapy: Secondary | ICD-10-CM | POA: Diagnosis not present

## 2018-10-04 LAB — RAPID URINE DRUG SCREEN, HOSP PERFORMED
AMPHETAMINES: NOT DETECTED
BENZODIAZEPINES: NOT DETECTED
Barbiturates: NOT DETECTED
COCAINE: NOT DETECTED
OPIATES: NOT DETECTED
Tetrahydrocannabinol: NOT DETECTED

## 2018-10-04 LAB — URINALYSIS, ROUTINE W REFLEX MICROSCOPIC
Bilirubin Urine: NEGATIVE
Glucose, UA: NEGATIVE mg/dL
HGB URINE DIPSTICK: NEGATIVE
Ketones, ur: NEGATIVE mg/dL
LEUKOCYTES UA: NEGATIVE
NITRITE: NEGATIVE
PROTEIN: NEGATIVE mg/dL
SPECIFIC GRAVITY, URINE: 1.008 (ref 1.005–1.030)
pH: 7 (ref 5.0–8.0)

## 2018-10-04 LAB — BASIC METABOLIC PANEL
ANION GAP: 10 (ref 5–15)
BUN: 10 mg/dL (ref 8–23)
CO2: 31 mmol/L (ref 22–32)
Calcium: 9.8 mg/dL (ref 8.9–10.3)
Chloride: 106 mmol/L (ref 98–111)
Creatinine, Ser: 0.85 mg/dL (ref 0.61–1.24)
GLUCOSE: 102 mg/dL — AB (ref 70–99)
Potassium: 3.6 mmol/L (ref 3.5–5.1)
Sodium: 147 mmol/L — ABNORMAL HIGH (ref 135–145)

## 2018-10-04 LAB — CBC
HCT: 48 % (ref 39.0–52.0)
HEMOGLOBIN: 15.5 g/dL (ref 13.0–17.0)
MCH: 28.8 pg (ref 26.0–34.0)
MCHC: 32.3 g/dL (ref 30.0–36.0)
MCV: 89.2 fL (ref 80.0–100.0)
NRBC: 0 % (ref 0.0–0.2)
PLATELETS: 232 10*3/uL (ref 150–400)
RBC: 5.38 MIL/uL (ref 4.22–5.81)
RDW: 13.9 % (ref 11.5–15.5)
WBC: 3.8 10*3/uL — ABNORMAL LOW (ref 4.0–10.5)

## 2018-10-04 LAB — ETHANOL

## 2018-10-04 MED ORDER — MECLIZINE HCL 25 MG PO TABS: 25.0000 mg | ORAL_TABLET | Freq: Three times a day (TID) | ORAL | 0 refills | Status: AC | PRN

## 2018-10-04 MED ORDER — MECLIZINE HCL 25 MG PO TABS
25.0000 mg | ORAL_TABLET | Freq: Once | ORAL | Status: AC
  Administered 2018-10-04: 25 mg via ORAL
  Filled 2018-10-04: qty 1

## 2018-10-04 NOTE — ED Notes (Addendum)
PATIENT GIVEN A SPRITE AND A SANDWICH

## 2018-10-04 NOTE — ED Notes (Signed)
Triage reported EMS took CBG prior to arrival and it resulted around 117. RN made aware.

## 2018-10-04 NOTE — ED Notes (Addendum)
Pt states he has a headache but has not had anything to eat and is hungry. Pt provided food to see if it helps relieve his headache.
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
## 2018-10-04 NOTE — ED Notes (Signed)
ED Provider at bedside. 

## 2018-10-04 NOTE — ED Notes (Signed)
Pt instructed to remain NPO at this time, until further orders rec from EDP

## 2018-10-04 NOTE — ED Notes (Signed)
Pt states he has been feeling 'dizzy" since 1900hrs yesterday, states had poor balance had to utilize the wall to keep from falling, felt like the room was spinning. States also had a HA at frontal area, states was also photo sensitive, states also vomited x 1 this am.
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
## 2018-10-04 NOTE — ED Notes (Signed)
Pt ambulatory in department with steady gait, requesting a work note verifying he was here today. States he still has a headache but reluctant to stay for additional test and/or meds. Pt has slept most to time here, listened to music without s/s of discomfort.

## 2018-10-04 NOTE — Discharge Instructions (Signed)
Your CT today showed no evidence of acute abnormality.  He did have the chronic microvascular changes we discussed.  Your other laboratory testing was reassuring and we thought you are slightly dehydrated.  Your drug screen was negative for any exposure that you were concerned about yesterday.  As you were able to ambulate after the meclizine, we feel comfortable discharging you with a prescription for the dizzy medicine, meclizine, and having you follow-up with a primary doctor.  If any symptoms change or worsen, please return to the nearest emergency department.  Please stay hydrated.

## 2018-10-04 NOTE — ED Provider Notes (Signed)
4:41 PM Care assumed for Dr. Venora Maples.    Time of transfer care, patient is awaiting results of CT head and reassessment after rehydration and meclizine.  Patient was reportedly dizzy prompting the initial work-up.    CT head has just returned showing no evidence of intercurrent ab normality aside from chronic microvascular disease.    Anticipate reassessment.  If patient is able to ambulate safely and is feeling better, despite discharge home.  If patient continues to have severe dizziness, will consider MRI.   Patient's drug screen was negative and reassuring.  5:42 PM On reassessment, patient was able to tolerate p.o.  He reports he still has some headache but was able to ambulate for me without falling.  He reports his dizziness is slightly improved after the meclizine.  Patient given prescription for meclizine.  Patient was offered MRI as well as headache cocktail and he would rather go home to rest.  Patient understood return precautions for any new or worsened symptoms.  Patient discharged in good condition.  Clinical Impression: 1. Acute nonintractable headache, unspecified headache type   2. Dizziness     Disposition: Discharge  Condition: Good  I have discussed the results, Dx and Tx plan with the pt(& family if present). He/she/they expressed understanding and agree(s) with the plan. Discharge instructions discussed at great length. Strict return precautions discussed and pt &/or family have verbalized understanding of the instructions. No further questions at time of discharge.    New Prescriptions   MECLIZINE (ANTIVERT) 25 MG TABLET    Take 1 tablet (25 mg total) by mouth 3 (three) times daily as needed for up to 15 days for dizziness.    Follow Up: [place] [id number] [phone] Schedule an appointment as soon as possible for a visit    [facility] DEPT [place] [id number] [place]       Janavia Rottman, Gwenyth Allegra, MD 10/04/18 1747
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
## 2018-10-25 ENCOUNTER — Encounter (HOSPITAL_COMMUNITY): Payer: Self-pay | Admitting: *Deleted

## 2018-10-25 ENCOUNTER — Emergency Department (HOSPITAL_COMMUNITY): Payer: PRIVATE HEALTH INSURANCE

## 2018-10-25 ENCOUNTER — Emergency Department (HOSPITAL_COMMUNITY)
Admission: EM | Admit: 2018-10-25 | Discharge: 2018-10-26 | Disposition: A | Discharge status: Home / Self Care | Payer: PRIVATE HEALTH INSURANCE | Attending: Emergency Medicine | Admitting: Emergency Medicine

## 2018-10-25 DIAGNOSIS — F1290 Cannabis use, unspecified, uncomplicated: Secondary | ICD-10-CM | POA: Insufficient documentation

## 2018-10-25 DIAGNOSIS — R05 Cough: Secondary | ICD-10-CM | POA: Insufficient documentation

## 2018-10-25 DIAGNOSIS — J449 Chronic obstructive pulmonary disease, unspecified: Secondary | ICD-10-CM | POA: Insufficient documentation

## 2018-10-25 DIAGNOSIS — R0602 Shortness of breath: Secondary | ICD-10-CM | POA: Insufficient documentation

## 2018-10-25 DIAGNOSIS — F4324 Adjustment disorder with disturbance of conduct: Secondary | ICD-10-CM | POA: Diagnosis present

## 2018-10-25 DIAGNOSIS — R059 Cough, unspecified: Secondary | ICD-10-CM

## 2018-10-25 DIAGNOSIS — F329 Major depressive disorder, single episode, unspecified: Secondary | ICD-10-CM | POA: Insufficient documentation

## 2018-10-25 DIAGNOSIS — F17210 Nicotine dependence, cigarettes, uncomplicated: Secondary | ICD-10-CM | POA: Insufficient documentation

## 2018-10-25 DIAGNOSIS — R45851 Suicidal ideations: Secondary | ICD-10-CM | POA: Insufficient documentation

## 2018-10-25 DIAGNOSIS — I10 Essential (primary) hypertension: Secondary | ICD-10-CM | POA: Insufficient documentation

## 2018-10-25 LAB — CBC WITH DIFFERENTIAL/PLATELET
Abs Immature Granulocytes: 0.01 10*3/uL (ref 0.00–0.07)
BASOS ABS: 0 10*3/uL (ref 0.0–0.1)
BASOS PCT: 1 %
EOS PCT: 8 %
Eosinophils Absolute: 0.3 10*3/uL (ref 0.0–0.5)
HCT: 47.6 % (ref 39.0–52.0)
HEMOGLOBIN: 15.5 g/dL (ref 13.0–17.0)
Immature Granulocytes: 0 %
LYMPHS PCT: 50 %
Lymphs Abs: 1.9 10*3/uL (ref 0.7–4.0)
MCH: 29.5 pg (ref 26.0–34.0)
MCHC: 32.6 g/dL (ref 30.0–36.0)
MCV: 90.7 fL (ref 80.0–100.0)
Monocytes Absolute: 0.3 10*3/uL (ref 0.1–1.0)
Monocytes Relative: 8 %
NRBC: 0 % (ref 0.0–0.2)
Neutro Abs: 1.2 10*3/uL — ABNORMAL LOW (ref 1.7–7.7)
Neutrophils Relative %: 33 %
PLATELETS: 175 10*3/uL (ref 150–400)
RBC: 5.25 MIL/uL (ref 4.22–5.81)
RDW: 14.1 % (ref 11.5–15.5)
WBC: 3.7 10*3/uL — AB (ref 4.0–10.5)

## 2018-10-25 LAB — URINALYSIS, ROUTINE W REFLEX MICROSCOPIC
BILIRUBIN URINE: NEGATIVE
Glucose, UA: NEGATIVE mg/dL
HGB URINE DIPSTICK: NEGATIVE
KETONES UR: NEGATIVE mg/dL
Leukocytes, UA: NEGATIVE
Nitrite: NEGATIVE
PROTEIN: NEGATIVE mg/dL
SPECIFIC GRAVITY, URINE: 1.02 (ref 1.005–1.030)
pH: 5 (ref 5.0–8.0)

## 2018-10-25 LAB — COMPREHENSIVE METABOLIC PANEL
ALT: 46 U/L — ABNORMAL HIGH (ref 0–44)
ANION GAP: 9 (ref 5–15)
AST: 47 U/L — ABNORMAL HIGH (ref 15–41)
Albumin: 4 g/dL (ref 3.5–5.0)
Alkaline Phosphatase: 79 U/L (ref 38–126)
BUN: 16 mg/dL (ref 8–23)
CHLORIDE: 107 mmol/L (ref 98–111)
CO2: 25 mmol/L (ref 22–32)
CREATININE: 0.94 mg/dL (ref 0.61–1.24)
Calcium: 8.9 mg/dL (ref 8.9–10.3)
GFR calc non Af Amer: >60 mL/min (ref >60)
Glucose, Bld: 144 mg/dL — ABNORMAL HIGH (ref 70–99)
POTASSIUM: 4.1 mmol/L (ref 3.5–5.1)
Sodium: 141 mmol/L (ref 135–145)
TOTAL PROTEIN: 7.5 g/dL (ref 6.5–8.1)
Total Bilirubin: 0.5 mg/dL (ref 0.3–1.2)

## 2018-10-25 MED ORDER — DULOXETINE HCL 30 MG PO CPEP
60.0000 mg | ORAL_CAPSULE | Freq: Every day | ORAL | Status: DC
  Administered 2018-10-25 – 2018-10-26 (×2): 60 mg via ORAL
  Filled 2018-10-25 (×2): qty 2

## 2018-10-25 MED ORDER — TAMSULOSIN HCL 0.4 MG PO CAPS
0.4000 mg | ORAL_CAPSULE | Freq: Every day | ORAL | Status: DC
  Administered 2018-10-25 – 2018-10-26 (×2): 0.4 mg via ORAL
  Filled 2018-10-25 (×2): qty 1

## 2018-10-25 MED ORDER — HYDROXYZINE HCL 25 MG PO TABS
25.0000 mg | ORAL_TABLET | Freq: Three times a day (TID) | ORAL | Status: DC | PRN
  Administered 2018-10-25: 25 mg via ORAL
  Filled 2018-10-25: qty 1

## 2018-10-25 MED ORDER — FLUOXETINE HCL 20 MG PO CAPS
20.0000 mg | ORAL_CAPSULE | Freq: Every day | ORAL | Status: DC
  Administered 2018-10-25 – 2018-10-26 (×2): 20 mg via ORAL
  Filled 2018-10-25 (×2): qty 1

## 2018-10-25 MED ORDER — METFORMIN HCL 500 MG PO TABS
500.0000 mg | ORAL_TABLET | Freq: Two times a day (BID) | ORAL | Status: DC
  Administered 2018-10-25 – 2018-10-26 (×2): 500 mg via ORAL
  Filled 2018-10-25 (×2): qty 1

## 2018-10-25 MED ORDER — ALBUTEROL SULFATE HFA 108 (90 BASE) MCG/ACT IN AERS: 2.0000 | INHALATION_SPRAY | RESPIRATORY_TRACT | Status: DC | PRN

## 2018-10-25 MED ORDER — LISINOPRIL 20 MG PO TABS
20.0000 mg | ORAL_TABLET | Freq: Every day | ORAL | Status: DC
  Administered 2018-10-25 – 2018-10-26 (×2): 20 mg via ORAL
  Filled 2018-10-25 (×2): qty 1

## 2018-10-25 MED ORDER — NICOTINE 21 MG/24HR TD PT24
21.0000 mg | MEDICATED_PATCH | Freq: Every day | TRANSDERMAL | Status: DC
  Filled 2018-10-25 (×2): qty 1

## 2018-10-25 MED ORDER — IPRATROPIUM-ALBUTEROL 0.5-2.5 (3) MG/3ML IN SOLN
3.0000 mL | Freq: Once | RESPIRATORY_TRACT | Status: AC
  Administered 2018-10-25: 3 mL via RESPIRATORY_TRACT
  Filled 2018-10-25: qty 3

## 2018-10-25 MED ORDER — ALUM & MAG HYDROXIDE-SIMETH 200-200-20 MG/5ML PO SUSP: 30.0000 mL | Freq: Four times a day (QID) | ORAL | Status: DC | PRN

## 2018-10-25 MED ORDER — TRAZODONE HCL 100 MG PO TABS
100.0000 mg | ORAL_TABLET | Freq: Every day | ORAL | Status: DC
  Administered 2018-10-25: 100 mg via ORAL
  Filled 2018-10-25: qty 1

## 2018-10-25 MED ORDER — ONDANSETRON HCL 4 MG PO TABS: 4.0000 mg | ORAL_TABLET | Freq: Three times a day (TID) | ORAL | Status: DC | PRN

## 2018-10-25 MED ORDER — ACETAMINOPHEN 325 MG PO TABS: 650.0000 mg | ORAL_TABLET | ORAL | Status: DC | PRN

## 2018-10-25 NOTE — ED Provider Notes (Signed)
Emergency Department Provider Note   I have reviewed the triage vital signs and the nursing notes.   HISTORY  Chief Complaint Generalized Body Aches and Cough   HPI [name] is a 62 y.o. male with PMH of COPD, HTN, and depression presents to the emergency department for evaluation of "just feeling sick."  Patient states he has had body aches, shortness of breath, coughing.  He is concerned that he may be developing pneumonia.  He denies any productive cough.  Unsure about fevers at home.  No chest pain.  Patient has not had any vomiting or diarrhea.  No abdominal discomfort.  He has a nebulizer machine at home but states is been broken for a  time.  He continues to smoke cigarettes.    Past Medical History:  Diagnosis Date  . COPD (chronic obstructive pulmonary disease) ([place])   . Depression   . Hypertension     There are no active problems to display for this patient.   History reviewed. No pertinent surgical history.  Allergies Codeine  No family history on file.  Social History Social History   Tobacco Use  . Smoking status: Current Some Day Smoker    Types: Cigarettes  . Smokeless tobacco: Never Used  Substance Use Topics  . Alcohol use: Yes    Comment: occasionally  . Drug use: Yes    Types: Marijuana    Review of Systems  Constitutional: No fever/chills. Positive body aches.  Eyes: No visual changes. ENT: No sore throat. Cardiovascular: Denies chest pain. Respiratory: Positive shortness of breath and cough.  Gastrointestinal: No abdominal pain.  No nausea, no vomiting.  No diarrhea.  No constipation. Genitourinary: Negative for dysuria. Musculoskeletal: Negative for back pain. Skin: Negative for rash. Neurological: Negative for headaches, focal weakness or numbness.  10-point ROS otherwise negative.  ____________________________________________   PHYSICAL EXAM:  VITAL SIGNS: ED Triage Vitals  Enc Vitals Group     BP 10/25/18 0747  (!) 146/106     Pulse Rate 10/25/18 0747 90     Resp 10/25/18 0747 16     Temp 10/25/18 0747 98.1 F (36.7 C)     Temp Source 10/25/18 0747 Oral     SpO2 10/25/18 0747 100 %     Weight --      Height 10/25/18 0747 5\' 11"  (1.803 m)     Pain Score 10/25/18 0759 10   Constitutional: Alert and oriented. Well appearing and in no acute distress. Eyes: Conjunctivae are normal. Head: Atraumatic. Nose: No congestion/rhinnorhea. Mouth/Throat: Mucous membranes are moist.  Neck: No stridor.  Cardiovascular: Normal rate, regular rhythm. Good peripheral circulation. Grossly normal heart sounds.   Respiratory: Normal respiratory effort.  No retractions. Lungs with mild end-expiratory wheezing bilaterally.  Gastrointestinal: Soft and nontender. No distention.  Musculoskeletal: No lower extremity tenderness nor edema. No gross deformities of extremities. Neurologic:  Normal speech and language. No gross focal neurologic deficits are appreciated.  Skin:  Skin is warm, dry and intact. No rash noted. ____________________________________________   LABS (all labs ordered are listed, but only abnormal results are displayed)  Labs Reviewed  COMPREHENSIVE METABOLIC PANEL - Abnormal; Notable for the following components:      Result Value   Glucose, Bld 144 (*)    AST 47 (*)    ALT 46 (*)    All other components within normal limits  CBC WITH DIFFERENTIAL/PLATELET - Abnormal; Notable for the following components:   WBC 3.7 (*)    Neutro  Abs 1.2 (*)    All other components within normal limits  URINALYSIS, ROUTINE W REFLEX MICROSCOPIC   ____________________________________________  EKG   EKG Interpretation  Date/Time:  Saturday October 25 2018 08:25:29 EST Ventricular Rate:  71 PR Interval:    QRS Duration: 81 QT Interval:  396 QTC Calculation: 431 R Axis:   8 Text Interpretation:  Sinus rhythm Normal ECG Confirmed by Veryl Speak [phone]) on 10/25/2018 8:28:48 AM Also confirmed by Veryl Speak [phone]), editor Philomena Doheny [phone])  on 10/25/2018 1:30:54 PM       ____________________________________________  RADIOLOGY  Dg Chest 2 View  Result Date: 10/25/2018 CLINICAL DATA:  Shortness of breath and dizziness over the last 4 days. EXAM: CHEST - 2 VIEW COMPARISON:  None. FINDINGS: Heart size is normal. Tortuosity of the aorta. The vascularity is normal. The lungs are clear. No effusions. No infiltrate or collapse. No significant bone finding. IMPRESSION: No active cardiopulmonary disease. Tortuosity of the thoracic aorta. Electronically Signed   By: Nelson Chimes M.D.   On: 10/25/2018 08:44    ____________________________________________   PROCEDURES  Procedure(s) performed:   Procedures   ____________________________________________   INITIAL IMPRESSION / ASSESSMENT AND PLAN / ED COURSE  Pertinent labs & imaging results that were available during my care of the patient were reviewed by me and considered in my medical decision making (see chart for details).  Patient with history of COPD presents to the emergency department with cough, body aches.  No runny nose, sore throat.  No hypoxemia here.  Patient is ambulatory around the department and joking with staff without difficulty.  Plan for screening labs and chest x-ray with patient's concern for pneumonia.   10:07 AM Patient reassessed. CXR and labs are unremarkable. Plan for COPD treatment. Patient now telling me that he thinks he needs to speak with someone about his depression. He says "I just don't trust myself" but will not openly acknowledge any suicidal or homicidal threats to me. He says "you have to be careful what you say around here." He will not engage in further discussion regarding his mental health with me. Plan for TTS evaluation here to assist. Patient voluntary at this time. He is medically clear for evaluation.   01:29 PM Patient has spoken with TTS and endorsing to them SI with plan to run  into traffic. Recommending inpatient placement. Ordered PRN medication and home meds. Would consider IVC if patient suddenly were to want to leave but he is committed to stay at this time.   I reviewed all nursing notes, vitals, pertinent old records, EKGs, labs, imaging (as available).  ____________________________________________  FINAL CLINICAL IMPRESSION(S) / ED DIAGNOSES  Final diagnoses:  Suicidal ideation  Cough    MEDICATIONS GIVEN DURING THIS VISIT:  Medications  acetaminophen (TYLENOL) tablet 650 mg (has no administration in time range)  ondansetron (ZOFRAN) tablet 4 mg (has no administration in time range)  alum & mag hydroxide-simeth (MAALOX/MYLANTA) 200-200-20 MG/5ML suspension 30 mL (has no administration in time range)  nicotine (NICODERM CQ - dosed in mg/24 hours) patch 21 mg (has no administration in time range)  DULoxetine (CYMBALTA) DR capsule 60 mg (has no administration in time range)  lisinopril (PRINIVIL,ZESTRIL) tablet 20 mg (has no administration in time range)  metFORMIN (GLUCOPHAGE) tablet 500 mg (has no administration in time range)  tamsulosin (FLOMAX) capsule 0.4 mg (has no administration in time range)  FLUoxetine (PROZAC) capsule 20 mg (has no administration in time range)  hydrOXYzine (ATARAX/VISTARIL) tablet  25 mg (has no administration in time range)  traZODone (DESYREL) tablet 100 mg (has no administration in time range)  ipratropium-albuterol (DUONEB) 0.5-2.5 (3) MG/3ML nebulizer solution 3 mL (3 mLs Nebulization Given 10/25/18 0844)    Note:  This document was prepared using Dragon voice recognition software and may include unintentional dictation errors.  Nanda Quinton, MD Emergency Medicine    , [facility], MD 10/25/18 1440

## 2018-10-25 NOTE — ED Notes (Signed)
Patient is walking around room, and has come up to several staff requesting a ham and cheese sandwich and something to drink. Patient was redirected back to room by staff.

## 2018-10-25 NOTE — Progress Notes (Signed)
This patient continues to meet inpatient criteria. CSW faxed information to the following facilities:   [facility] [place], [place] Social Worker [phone]

## 2018-10-25 NOTE — ED Notes (Signed)
Pt in room in bed.  Pt is alert and awake.  Pt denies pain or discomfort at this time.  Pt denies SI, HI and AVH and verbally contracts for safety.

## 2018-10-25 NOTE — ED Notes (Signed)
Bed: [facility] Expected date:  Expected time:  Means of arrival:  Comments: Hold for 11

## 2018-10-25 NOTE — ED Notes (Signed)
Patient given [place] sandwich and coffee. Ok per provider.

## 2018-10-25 NOTE — ED Notes (Signed)
Patient transported to X-ray 
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
## 2018-10-25 NOTE — ED Triage Notes (Signed)
Pt complains of coughing, difficulty breathing, generalized body aches for the past 5 days. Pt is concerned he has pneumonia.

## 2018-10-25 NOTE — ED Notes (Signed)
Pt demanded to have something other than a [place] sandwich or a ham sandwich and did not want cheese and crackers or peanut butter and crackers. He then accused this Probation officer for being rude. He accepted a sandwich from another nurse.

## 2018-10-25 NOTE — BH Assessment (Signed)
[facility] Assessment Progress Note  Case was staffed with Parks FNP who recommended a inpatient admission to assist with stabilization.     

## 2018-10-26 DIAGNOSIS — R45851 Suicidal ideations: Secondary | ICD-10-CM

## 2018-10-26 DIAGNOSIS — F4324 Adjustment disorder with disturbance of conduct: Secondary | ICD-10-CM | POA: Diagnosis present

## 2018-10-26 DIAGNOSIS — R05 Cough: Secondary | ICD-10-CM

## 2018-10-26 NOTE — ED Notes (Signed)
Pt verbally abusive towards nursing staff, yelling, cussing at nursing staff.

## 2018-10-26 NOTE — Discharge Instructions (Signed)
For your mental health and housing needs, please follow up with:   [facility] [address] [place], [place] [address] [phone]  [facility] [place], [place] [address] [phone]

## 2018-10-26 NOTE — Progress Notes (Signed)
CSW aware patient aware patient is medically and psychiatrically cleared for discharge.  CSW met with patient at bedside to offer homelessness resources. Patient used derogatory language regarding nursing staff and psychiatry team. Patient refused social work resources and bus pass. Patient state's "where am I going? I'm a well dressed man, I'm not taking a bus anywhere. I'll be back here later."  CSW signing off as patient has declined services.   Stephanie Acre, [place] Social Worker [phone]

## 2018-10-26 NOTE — Consult Note (Addendum)
[facility] ED Discharge  10/26/2018 11:07 AM Devin Myers  MRN:  [id number] Principal Problem: Adjustment disorder with disturbance of conduct Discharge Diagnoses:  Patient Active Problem List   Diagnosis Date Noted  . Adjustment disorder with disturbance of conduct [F43.24] 10/26/2018    Subjective: Pt denies suicidal/homicidal ideation, denies auditory/visual hallucinations and does not appear to be responding to internal stimuli. Pt moved here from [place], [place] and moved in with his daughter who is a Ship broker. They got into an argument and she asked him to leave.  Pt stated he was standing on the sidewalk in front of his church and someone thought he was going to step in front of a car and they called the police. Pt denied that this was true. Pt stated he has no intent or plan to kill himself because only God has the plan of when we die. 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 Pt's UDS and BAL are negative. Pt is psychiatrically clear for discharge.   Total Time spent with patient: 30 minutes  Past Psychiatric History: EDP  Past Medical History:  Past Medical History:  Diagnosis Date  . COPD (chronic obstructive pulmonary disease) ([place])   . Depression   . Hypertension    History reviewed. No pertinent surgical history. Family History: No family history on file. Family Psychiatric  History: Pt did not answer this question Social History:  Social History   Substance and Sexual Activity  Alcohol Use Yes   Comment: occasionally    Social History   Substance and Sexual Activity  Drug Use Yes  . Types: Marijuana   Social History   Socioeconomic History  . Marital status: Divorced    Spouse name: Not on file  . Number of children: Not on file  . Years of education: Not on file  . Highest education level: Not on file  Occupational History  . Not on file  Social Needs  . Financial resource strain: Not on file  . Food insecurity:     Worry: Not on file    Inability: Not on file  . Transportation needs:    Medical: Not on file    Non-medical: Not on file  Tobacco Use  . Smoking status: Current Some Day Smoker    Types: Cigarettes  . Smokeless tobacco: Never Used  Substance and Sexual Activity  . Alcohol use: Yes    Comment: occasionally  . Drug use: Yes    Types: Marijuana  . Sexual activity: Not on file  Lifestyle  . Physical activity:    Days per week: Not on file    Minutes per session: Not on file  . Stress: Not on file  Relationships  . Social connections:    Talks on phone: Not on file    Gets together: Not on file    Attends religious service: Not on file    Active member of club or organization: Not on file    Attends meetings of clubs or organizations: Not on file    Relationship status: Not on file  Other Topics Concern  . Not on file  Social History Narrative  . Not on file    Has this patient used any form of tobacco in the last 30 days? (Cigarettes, Smokeless Tobacco, Cigars, and/or Pipes) Prescription not provided because: Pt declined  Current Medications: Current Facility-Administered Medications  Medication Dose Route Frequency Provider Last Rate Last Dose  . acetaminophen (TYLENOL) tablet 650  mg  650 mg Oral Q4H PRN Long, Wonda Olds, MD      . albuterol (PROVENTIL HFA;VENTOLIN HFA) 108 (90 Base) MCG/ACT inhaler 2 puff  2 puff Inhalation Q4H PRN Long, Wonda Olds, MD      . alum & mag hydroxide-simeth (MAALOX/MYLANTA) 200-200-20 MG/5ML suspension 30 mL  30 mL Oral Q6H PRN Long, Wonda Olds, MD      . DULoxetine (CYMBALTA) DR capsule 60 mg  60 mg Oral Daily Long, Wonda Olds, MD   60 mg at 10/26/18 1046  . FLUoxetine (PROZAC) capsule 20 mg  20 mg Oral Daily Ethelene Hal, NP   20 mg at 10/26/18 1046  . hydrOXYzine (ATARAX/VISTARIL) tablet 25 mg  25 mg Oral TID PRN Ethelene Hal, NP   25 mg at 10/25/18 1447  . lisinopril (PRINIVIL,ZESTRIL) tablet 20 mg  20 mg Oral Daily Long, Wonda Olds, MD   20 mg at 10/26/18 1046  . metFORMIN (GLUCOPHAGE) tablet 500 mg  500 mg Oral BID WC Long, Wonda Olds, MD   500 mg at 10/26/18 0819  . nicotine (NICODERM CQ - dosed in mg/24 hours) patch 21 mg  21 mg Transdermal Daily Long, Wonda Olds, MD      . ondansetron [facility]) tablet 4 mg  4 mg Oral Q8H PRN Long, Wonda Olds, MD      . tamsulosin (FLOMAX) capsule 0.4 mg  0.4 mg Oral Daily Long, Wonda Olds, MD   0.4 mg at 10/26/18 1046  . traZODone (DESYREL) tablet 100 mg  100 mg Oral QHS Ethelene Hal, NP   100 mg at 10/25/18 2200   Current Outpatient Medications  Medication Sig Dispense Refill  . budesonide-formoterol (SYMBICORT) 80-4.5 MCG/ACT inhaler Inhale 2 puffs into the lungs 2 (two) times daily.    . DULoxetine (CYMBALTA) 60 MG capsule Take 60 mg by mouth daily.     Marland Kitchen lisinopril (PRINIVIL,ZESTRIL) 20 MG tablet Take 20 mg by mouth daily.     . metFORMIN (GLUCOPHAGE) 500 MG tablet Take 500 mg by mouth 2 (two) times daily with a meal.    . tamsulosin (FLOMAX) 0.4 MG CAPS capsule Take 0.4 mg by mouth daily.      PTA Medications:  (Not in a hospital admission)  Musculoskeletal: Strength & Muscle Tone: within normal limits Gait & Station: normal Patient leans: N/A  Psychiatric Specialty Exam: Physical Exam  ROS  Blood pressure (!) 125/95, pulse 93, temperature 98.5 F (36.9 C), temperature source Oral, resp. rate 14, height 5\' 11"  (1.803 m), SpO2 96 %.Body mass index is 22.04 kg/m.  General Appearance: Casual  Eye Contact:  Good  Speech:  Clear and Coherent and Normal Rate  Volume:  Normal  Mood:  Irritable  Affect:  Congruent  Thought Process:  Coherent, Goal Directed, Linear and Descriptions of Associations: Intact  Orientation:  Full (Time, Place, and Person)  Thought Content:  Logical  Suicidal Thoughts:  No  Homicidal Thoughts:  No  Memory:  Immediate;   Good Recent;   Fair Remote;   Fair  Judgement:  Fair  Insight:  Fair  Psychomotor Activity:  Normal  Concentration:   Concentration: Good and Attention Span: Good  Recall:  Good  Fund of Knowledge:  Good  Language:  Good  Akathisia:  No  Handed:  Right  AIMS (if indicated):     Assets:  Agricultural consultant Housing Social Support  ADL's:  Intact  Cognition:  WNL  Sleep:  Demographic Factors:  Male and Low socioeconomic status  Loss Factors: Financial problems/change in socioeconomic status  Historical Factors: NA  Risk Reduction Factors:   Sense of responsibility to family and Living with another person, especially a relative  Continued Clinical Symptoms:  Personality Disorders:   Cluster B  Cognitive Features That Contribute To Risk:  Closed-mindedness    Suicide Risk:  Minimal: No identifiable suicidal ideation.  Patients presenting with no risk factors but with morbid ruminations; may be classified as minimal risk based on the severity of the depressive symptoms   Plan Of Care/Follow-up recommendations:  Activity:  as tolerated Diet:  Heart healthy  Disposition: Adjustment Disorder with disturbance of conduct: Take all medications as prescribed by your outpatient provider Follow up at [facility] for medication management and therapy services. Keep all follow-up appointments as scheduled. Make an appointment with [facility] upon discharge.  Do not consume alcohol or use illegal drugs while on prescription medications. Report any adverse effects from your medications to your primary care provider promptly.  In the event of recurrent symptoms or worsening symptoms, call 911, a crisis hotline, or go to the nearest emergency department for evaluation.   Ethelene Hal, NP 10/26/2018, 11:07 AM  Patient seen face-to-face for psychiatric evaluation, chart reviewed and case discussed with the physician extender and developed treatment plan. Reviewed the information documented and agree with the treatment plan. Corena Pilgrim, MD

## 2018-10-26 NOTE — ED Notes (Signed)
Pt d/c home per MD order. Pt remains verbally aggressive..harassing nursing staff. Discharge summary reviewed, pt uninterested. Security present. Pt ambulatory off unit.

## 2018-10-26 NOTE — ED Notes (Signed)
Pt refused to have vital signs  taken by nursing staff, prior to discharge nurse was notified

## 2018-10-26 NOTE — Progress Notes (Signed)
This patient continues to meet inpatient criteria. CSW faxed information to the following facilities:   [facility] [place], [place] Social Worker [phone]

## 2018-11-10 ENCOUNTER — Encounter (HOSPITAL_COMMUNITY): Payer: Self-pay | Admitting: Emergency Medicine

## 2018-11-10 ENCOUNTER — Ambulatory Visit (HOSPITAL_COMMUNITY)
Admission: EM | Admit: 2018-11-10 | Discharge: 2018-11-10 | Disposition: A | Discharge status: Home / Self Care | Payer: Self-pay | Attending: Family Medicine | Admitting: Family Medicine

## 2018-11-10 ENCOUNTER — Other Ambulatory Visit: Payer: Self-pay

## 2018-11-10 DIAGNOSIS — J42 Unspecified chronic bronchitis: Secondary | ICD-10-CM

## 2018-11-10 MED ORDER — ALBUTEROL SULFATE HFA 108 (90 BASE) MCG/ACT IN AERS
1.0000 | INHALATION_SPRAY | Freq: Four times a day (QID) | RESPIRATORY_TRACT | Status: DC
  Administered 2018-11-10: 2 via RESPIRATORY_TRACT

## 2018-11-10 MED ORDER — ALBUTEROL SULFATE HFA 108 (90 BASE) MCG/ACT IN AERS
INHALATION_SPRAY | RESPIRATORY_TRACT | Status: AC
  Filled 2018-11-10: qty 6.7

## 2018-11-10 NOTE — ED Triage Notes (Addendum)
Pt states he lives in a facility and has only been here in [place] for less than a month.  During the day he states they make them go outside all day.  He states he has COPD and cant stay outside.  He complains of SOB and leg pain and states he needs a note from a doctor so they will let him in during the day.  Pt is a poor historian and has complaints about where he lives.  He states he does not know the area at all so there is no pharmacy that he knows where to go to.  Pt states his daughter lives here but he states he doesn't want to talk about her.

## 2018-11-10 NOTE — Discharge Instructions (Addendum)
We are giving you an inhaler here in clinic to use as needed for cough, wheezing, shortness of breath. I am writing a note with restrictions we talked about Good luck and I hope everything works out for you. I am sorry you are going through a hard time.

## 2018-11-14 NOTE — ED Provider Notes (Signed)
[facility]    CSN: [id number] Arrival date & time: 11/10/18  8527     History   Chief Complaint Chief Complaint  Patient presents with  . Shortness of Breath    HPI Devin Myers is a 62 y.o. male.   Pt is a 62 year old male with PMH of COPD, depression and hypertension. He presents today with cough, wheezing that is chronic for him. He is here today with concerns for his health and the facility he is living in. Pt states he lives in a facility and has only been here in [place] for less than a month.  During the day he states they make them go outside all day in the cold.  He complains of SOB and leg pain and wants a note from a doctor so they will let him in during the day. They make him use the stairs and he would prefer to use the elevator. He is very upset about his situation and tearful.   ROS per HPI     Shortness of Breath    Past Medical History:  Diagnosis Date  . COPD (chronic obstructive pulmonary disease) ([place])   . Depression   . Hypertension     Patient Active Problem List   Diagnosis Date Noted  . 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 No pertinent surgical history.     Home Medications    Prior to Admission medications   Medication Sig Start Date End Date Taking? Authorizing Provider  budesonide-formoterol (SYMBICORT) 80-4.5 MCG/ACT inhaler Inhale 2 puffs into the lungs 2 (two) times daily.    [provider]  DULoxetine (CYMBALTA) 60 MG capsule Take 60 mg by mouth daily.     [provider]  lisinopril (PRINIVIL,ZESTRIL) 20 MG tablet Take 20 mg by mouth daily.     [provider]  metFORMIN (GLUCOPHAGE) 500 MG tablet Take 500 mg by mouth 2 (two) times daily with a meal.    [provider]  tamsulosin (FLOMAX) 0.4 MG CAPS capsule Take 0.4 mg by mouth daily.     [provider]    Family History History reviewed. No pertinent family  history.  Social History Social History   Tobacco Use  . Smoking status: Current Some Day Smoker    Types: Cigarettes  . Smokeless tobacco: Never Used  Substance Use Topics  . Alcohol use: Yes    Comment: occasionally  . Drug use: Yes    Types: Marijuana     Allergies   Codeine   Review of Systems Review of Systems  Respiratory: Positive for shortness of breath.      Physical Exam Triage Vital Signs ED Triage Vitals  Enc Vitals Group     BP 11/10/18 1002 (!) 151/101     Pulse Rate 11/10/18 1002 92     Resp --      Temp 11/10/18 1002 97.6 F (36.4 C)     Temp Source 11/10/18 1002 Oral     SpO2 11/10/18 1002 98 %     Weight --      Height --      Head Circumference --      Peak Flow --      Pain Score 11/10/18 0957 10     Pain Loc --      Pain Edu? --      Excl. in [place]? --    No data found.  Updated Vital Signs BP Marland Kitchen)  151/101 (BP Location: Right Arm)   Pulse 92   Temp 97.6 F (36.4 C) (Oral)   SpO2 98%   Visual Acuity Right Eye Distance:   Left Eye Distance:   Bilateral Distance:    Right Eye Near:   Left Eye Near:    Bilateral Near:     Physical Exam  Constitutional: He appears well-developed and well-nourished.  Non-toxic appearance. He does not appear ill.  Tearful   HENT:  Head: Normocephalic.  Mouth/Throat: Oropharynx is clear and moist.  Neck: Normal range of motion.  Cardiovascular: Normal rate, regular rhythm and normal heart sounds.  Pulmonary/Chest: Effort normal. He has decreased breath sounds.  Musculoskeletal: Normal range of motion.  Neurological: He is alert.  Skin: Skin is warm and dry.  Psychiatric: He has a normal mood and affect.  Nursing note and vitals reviewed.    UC Treatments / Results  Labs (all labs ordered are listed, but only abnormal results are displayed) Labs Reviewed - No data to display  EKG None  Radiology No results found.  Procedures Procedures (including critical care time)  Medications  Ordered in UC Medications - No data to display  Initial Impression / Assessment and Plan / UC Course  I have reviewed the triage vital signs and the nursing notes.  Pertinent labs & imaging results that were available during my care of the patient were reviewed by me and considered in my medical decision making (see chart for details).     COPD-albuterol inhaler given here in clinic for cough wheezing, shortness of breath Pt main concern today was a note for the facility that he lives in.  Note given as requested for restrictions at his living facility due to chronic medical problems Final Clinical Impressions(s) / UC Diagnoses   Final diagnoses:  Chronic bronchitis, unspecified chronic bronchitis type [facility])     Discharge Instructions     We are giving you an inhaler here in clinic to use as needed for cough, wheezing, shortness of breath. I am writing a note with restrictions we talked about Good luck and I hope everything works out for you. I am sorry you are going through a hard time.     ED Prescriptions    None     Controlled Substance Prescriptions [place] Controlled Substance Registry consulted? Not Applicable   Orvan July, NP 11/14/18 [phone]

## 2018-11-17 ENCOUNTER — Encounter: Payer: Self-pay | Admitting: Pediatric Intensive Care

## 2018-11-17 ENCOUNTER — Other Ambulatory Visit: Payer: Self-pay | Admitting: Critical Care Medicine

## 2018-11-17 DIAGNOSIS — E119 Type 2 diabetes mellitus without complications: Secondary | ICD-10-CM

## 2018-11-17 LAB — GLUCOSE, POCT (MANUAL RESULT ENTRY): POC GLUCOSE: 121 mg/dL — AB (ref 70–99)

## 2018-11-17 MED ORDER — METFORMIN HCL 500 MG PO TABS: 500.0000 mg | ORAL_TABLET | Freq: Two times a day (BID) | ORAL | 6 refills | Status: DC

## 2018-11-17 MED ORDER — BUDESONIDE-FORMOTEROL FUMARATE 80-4.5 MCG/ACT IN AERO: 2.0000 | INHALATION_SPRAY | Freq: Two times a day (BID) | RESPIRATORY_TRACT | 1 refills | Status: DC

## 2018-11-17 MED ORDER — LISINOPRIL 20 MG PO TABS: 20.0000 mg | ORAL_TABLET | Freq: Every day | ORAL | 1 refills | Status: DC

## 2018-11-17 MED ORDER — DULOXETINE HCL 60 MG PO CPEP: 60.0000 mg | ORAL_CAPSULE | Freq: Every day | ORAL | 6 refills | Status: DC

## 2018-11-17 MED ORDER — TAMSULOSIN HCL 0.4 MG PO CAPS: 0.4000 mg | ORAL_CAPSULE | Freq: Every day | ORAL | 1 refills | Status: DC

## 2018-11-17 MED FILL — SYMBICORT 80-4.5 MCG INH: 80-4.5 | 30 days supply | Qty: 10 | Fill #0

## 2018-11-17 MED FILL — metFORMIN HCL 500 MG TABS: 500 | 30 days supply | Qty: 60 | Fill #0

## 2018-11-17 MED FILL — LISINOPRIL 20 MG TABLET: 20 | 30 days supply | Qty: 30 | Fill #0

## 2018-11-17 MED FILL — DULoxetine HCL 60 MG CPEP: 60 | 30 days supply | Qty: 30 | Fill #0

## 2018-11-17 MED FILL — TAMSULOSIN HCL 0.4 MG CAP: 0.4 | 30 days supply | Qty: 30 | Fill #0

## 2018-11-17 NOTE — Congregational Nurse Program (Signed)
New client encounter. Client states he's been in [place] for 3 months. States history of hypertension, "diabetes", depression and endorses very "stressed" since being at the shelter. Client has been seen at ED but has no medication at present. Client also states history of COPD and has had a URI since being at shelter. BBS- decreased air movement throughout but no wheezing or rhonchi noted. Plan: Notify Dr Joya Gaskins of client medication needs and refer to primary care.

## 2018-11-17 NOTE — Progress Notes (Signed)
Needs refills on all medications. Sent to [facility]

## 2018-11-18 ENCOUNTER — Encounter: Payer: Self-pay | Admitting: Pediatric Intensive Care

## 2018-11-18 ENCOUNTER — Emergency Department (HOSPITAL_COMMUNITY)
Admission: EM | Admit: 2018-11-18 | Discharge: 2018-11-19 | Disposition: A | Discharge status: Home / Self Care | Payer: Self-pay | Attending: Emergency Medicine | Admitting: Emergency Medicine

## 2018-11-18 ENCOUNTER — Encounter (HOSPITAL_COMMUNITY): Payer: Self-pay | Admitting: Emergency Medicine

## 2018-11-18 ENCOUNTER — Emergency Department (HOSPITAL_COMMUNITY): Payer: Self-pay

## 2018-11-18 DIAGNOSIS — J40 Bronchitis, not specified as acute or chronic: Secondary | ICD-10-CM | POA: Insufficient documentation

## 2018-11-18 DIAGNOSIS — I10 Essential (primary) hypertension: Secondary | ICD-10-CM | POA: Insufficient documentation

## 2018-11-18 DIAGNOSIS — R55 Syncope and collapse: Secondary | ICD-10-CM | POA: Insufficient documentation

## 2018-11-18 DIAGNOSIS — R0789 Other chest pain: Secondary | ICD-10-CM | POA: Insufficient documentation

## 2018-11-18 DIAGNOSIS — F17210 Nicotine dependence, cigarettes, uncomplicated: Secondary | ICD-10-CM | POA: Insufficient documentation

## 2018-11-18 DIAGNOSIS — Z9181 History of falling: Secondary | ICD-10-CM | POA: Insufficient documentation

## 2018-11-18 DIAGNOSIS — J449 Chronic obstructive pulmonary disease, unspecified: Secondary | ICD-10-CM | POA: Insufficient documentation

## 2018-11-18 LAB — CBC
HCT: 45 % (ref 39.0–52.0)
Hemoglobin: 14.7 g/dL (ref 13.0–17.0)
MCH: 28.8 pg (ref 26.0–34.0)
MCHC: 32.7 g/dL (ref 30.0–36.0)
MCV: 88.1 fL (ref 80.0–100.0)
PLATELETS: 207 10*3/uL (ref 150–400)
RBC: 5.11 MIL/uL (ref 4.22–5.81)
RDW: 13.8 % (ref 11.5–15.5)
WBC: 4.6 10*3/uL (ref 4.0–10.5)
nRBC: 0 % (ref 0.0–0.2)

## 2018-11-18 LAB — BASIC METABOLIC PANEL
ANION GAP: 8 (ref 5–15)
BUN: 12 mg/dL (ref 8–23)
CALCIUM: 9.2 mg/dL (ref 8.9–10.3)
CO2: 26 mmol/L (ref 22–32)
CREATININE: 0.84 mg/dL (ref 0.61–1.24)
Chloride: 106 mmol/L (ref 98–111)
GFR calc Af Amer: >60 mL/min (ref >60)
GLUCOSE: 108 mg/dL — AB (ref 70–99)
Potassium: 3.8 mmol/L (ref 3.5–5.1)
Sodium: 140 mmol/L (ref 135–145)

## 2018-11-18 LAB — I-STAT TROPONIN, ED: TROPONIN I, POC: 0.01 ng/mL (ref 0.00–0.08)

## 2018-11-18 MED ORDER — DEXAMETHASONE SODIUM PHOSPHATE 10 MG/ML IJ SOLN
10.0000 mg | Freq: Once | INTRAMUSCULAR | Status: AC
  Administered 2018-11-19: 10 mg via INTRAVENOUS
  Filled 2018-11-18: qty 1

## 2018-11-18 MED ORDER — ALBUTEROL SULFATE HFA 108 (90 BASE) MCG/ACT IN AERS
2.0000 | INHALATION_SPRAY | Freq: Once | RESPIRATORY_TRACT | Status: AC
  Administered 2018-11-19: 2 via RESPIRATORY_TRACT
  Filled 2018-11-18: qty 6.7

## 2018-11-18 MED ORDER — KETOROLAC TROMETHAMINE 30 MG/ML IJ SOLN
30.0000 mg | Freq: Once | INTRAMUSCULAR | Status: AC
  Administered 2018-11-19: 30 mg via INTRAVENOUS
  Filled 2018-11-18: qty 1

## 2018-11-18 NOTE — ED Triage Notes (Signed)
Pt from shelter, staff report a syncopel episode while sitting in chair (no fall).  Now reports chest pressure, given ASA, nitro (dropped bp 40 points) to 135/97.

## 2018-11-18 NOTE — ED Notes (Signed)
Called lab adding on d-dimer

## 2018-11-18 NOTE — ED Provider Notes (Signed)
[facility] EMERGENCY DEPARTMENT Provider Note   CSN: [id number] Arrival date & time: 11/18/18  2040     History   Chief Complaint Chief Complaint  Patient presents with  . Chest Pain  . Loss of Consciousness    HPI Devin Myers is a 62 y.o. male.  HPI  This is a 62 year old male with history of COPD, hypertension, depression who presents with chest discomfort.  Patient reports onset of symptoms around 8 PM.  He was walking at the time.  He reports pressure mostly of the left chest.  He has had a cough for the last 2 weeks that has been nonproductive.  States that he feels like he has had fevers.  Currently his pain is 9 out of 10.  It does not radiate.  Patient states that he did not pass out but he "fell."  He reports history of falling secondary to "my legs just give out on me."  Patient denies any leg swelling or discomfort.  No known history of blood clots.  Denies any history of coronary artery disease.  Patient has had 2 recent visits for upper respiratory symptoms and COPD exacerbation.  Past Medical History:  Diagnosis Date  . COPD (chronic obstructive pulmonary disease) ([place])   . Depression   . Hypertension     Patient Active Problem List   Diagnosis Date Noted  . 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 No pertinent surgical history.      Home Medications    Prior to Admission medications   Medication Sig Start Date End Date Taking? Authorizing Provider  budesonide-formoterol (SYMBICORT) 80-4.5 MCG/ACT inhaler Inhale 2 puffs into the lungs 2 (two) times daily. 11/17/18  Yes Elsie Stain, MD  DULoxetine (CYMBALTA) 60 MG capsule Take 1 capsule (60 mg total) by mouth daily. 11/17/18  Yes Elsie Stain, MD  lisinopril (PRINIVIL,ZESTRIL) 20 MG tablet Take 1 tablet (20 mg total) by mouth daily. 11/17/18  Yes Elsie Stain, MD  metFORMIN (GLUCOPHAGE) 500 MG tablet Take 1 tablet (500 mg total) by  mouth 2 (two) times daily with a meal. 11/17/18  Yes Elsie Stain, MD  tamsulosin (FLOMAX) 0.4 MG CAPS capsule Take 1 capsule (0.4 mg total) by mouth daily. 11/17/18  Yes Elsie Stain, MD    Family History No family history on file.  Social History Social History   Tobacco Use  . Smoking status: Current Some Day Smoker    Types: Cigarettes  . Smokeless tobacco: Never Used  Substance Use Topics  . Alcohol use: Yes    Comment: occasionally  . Drug use: Yes    Types: Marijuana     Allergies   Codeine   Review of Systems Review of Systems  Constitutional: Positive for chills. Negative for fever.  Respiratory: Positive for cough, chest tightness and shortness of breath.   Cardiovascular: Negative for chest pain and leg swelling.  Gastrointestinal: Negative for abdominal pain, diarrhea and vomiting.  Neurological: Negative for syncope.  All other systems reviewed and are negative.    Physical Exam Updated Vital Signs BP (!) 130/93   Pulse 74   Temp 98.9 F (37.2 C) (Oral)   Resp 16   Ht 1.803 m (5\' 11" )   SpO2 94%   BMI 22.04 kg/m   Physical Exam  Constitutional: He is oriented to person, place, and time. He appears well-developed and well-nourished. No distress.  HENT:  Head: Normocephalic and atraumatic.  Neck: Neck supple.  Cardiovascular: Normal rate, regular rhythm, normal heart sounds and normal pulses.  No murmur heard. Tenderness palpation left chest wall, no crepitus  Pulmonary/Chest: Effort normal. No respiratory distress. He has wheezes.  Occasional wheeze and cough, no respiratory distress  Abdominal: Soft. Bowel sounds are normal. There is no tenderness. There is no rebound.  Musculoskeletal: He exhibits no edema.       Right lower leg: He exhibits no tenderness and no edema.       Left lower leg: He exhibits no tenderness and no edema.  Lymphadenopathy:    He has no cervical adenopathy.  Neurological: He is alert and oriented to person,  place, and time.  Skin: Skin is warm and dry.  Psychiatric: He has a normal mood and affect.  Nursing note and vitals reviewed.    ED Treatments / Results  Labs (all labs ordered are listed, but only abnormal results are displayed) Labs Reviewed  BASIC METABOLIC PANEL - Abnormal; Notable for the following components:      Result Value   Glucose, Bld 108 (*)    All other components within normal limits  CBC  D-DIMER, QUANTITATIVE (NOT AT [facility])  I-STAT TROPONIN, ED  I-STAT TROPONIN, ED    EKG EKG Interpretation  Date/Time:  Tuesday November 18 2018 20:46:55 EST Ventricular Rate:  85 PR Interval:  144 QRS Duration: 74 QT Interval:  382 QTC Calculation: 034 R Axis:   21 Text Interpretation:  Normal sinus rhythm Normal ECG Confirmed by Thayer Jew [phone]) on 11/18/2018 11:06:44 PM   Radiology Dg Chest 2 View  Result Date: 11/18/2018 CLINICAL DATA:  Syncopal episode EXAM: CHEST - 2 VIEW COMPARISON:  10/25/2018 FINDINGS: Tortuous atherosclerotic aorta is redemonstrated. Normal heart size. Lungs are clear with minimal atelectasis at the left lung base. No acute osseous abnormality. IMPRESSION: No active cardiopulmonary disease. Electronically Signed   By: Ashley Royalty M.D.   On: 11/18/2018 21:19    Procedures Procedures (including critical care time)  Medications Ordered in ED Medications  ketorolac (TORADOL) 30 MG/ML injection 30 mg (30 mg Intravenous Given 11/19/18 0057)  albuterol (PROVENTIL HFA;VENTOLIN HFA) 108 (90 Base) MCG/ACT inhaler 2 puff (2 puffs Inhalation Given 11/19/18 0057)  dexamethasone (DECADRON) injection 10 mg (10 mg Intravenous Given 11/19/18 0056)     Initial Impression / Assessment and Plan / ED Course  I have reviewed the triage vital signs and the nursing notes.  Pertinent labs & imaging results that were available during my care of the patient were reviewed by me and considered in my medical decision making (see chart for details).      Patient presents with chest discomfort.  Also reports recent cough.  History of COPD.  He is overall nontoxic-appearing vital signs are reassuring.  Reported syncope but patient denies this.  He states that he fell.  He has some reproducible pain on exam.  He also has some occasional wheeze.  With a cough this may represent bronchitis or mild COPD exacerbation.  Patient was given Toradol.  EKG is normal and initial troponin is negative.  Lower suspicion for ACS.  No risk factors for PE.  Repeat troponin remains negative.  Patient has been sleeping during his entire ED stay without any recurrent complaints.  He was given albuterol and Decadron.  Will discharge with albuterol for possible bronchitis.  Chest pain is very atypical and at this time do not feel he needs admission or emergent work-up.  After history, exam, and  medical workup I feel the patient has been appropriately medically screened and is safe for discharge home. Pertinent diagnoses were discussed with the patient. Patient was given return precautions.   Final Clinical Impressions(s) / ED Diagnoses   Final diagnoses:  Atypical chest pain  Bronchitis    ED Discharge Orders    None       Merryl Hacker, MD 11/19/18 0201

## 2018-11-19 LAB — D-DIMER, QUANTITATIVE: D-Dimer, Quant: 0.27 ug/mL-FEU (ref 0.00–0.50)

## 2018-11-19 LAB — I-STAT TROPONIN, ED: Troponin i, poc: 0 ng/mL (ref 0.00–0.08)

## 2018-11-19 NOTE — ED Notes (Signed)
Patient refused second troponin. States "I don't need to give more blood, you've already taken enough. I still feel the same way as I did when I first came". This RN tried to explain the process and that I had medication to give him but he refused at this time and is requesting to speak with provider.   MD made aware.

## 2018-11-19 NOTE — ED Notes (Signed)
Patient verbalizes understanding of medications and discharge instructions. No further questions at this time. VSS and patient ambulatory at discharge.   

## 2018-11-25 NOTE — Congregational Nurse Program (Signed)
Client in office to receive medication. Client has been arguing with shelter staff and is very agitated. Client insists that shelter staff and guests are the source of his "problems". He is pounding desk with his fists. CN asks client to please calm down. Client took medication and left office.

## 2019-01-01 ENCOUNTER — Ambulatory Visit (HOSPITAL_COMMUNITY)
Admission: EM | Admit: 2019-01-01 | Discharge: 2019-01-01 | Disposition: A | Discharge status: Home / Self Care | Payer: Self-pay | Attending: Family Medicine | Admitting: Family Medicine

## 2019-01-01 ENCOUNTER — Encounter (HOSPITAL_COMMUNITY): Payer: Self-pay | Admitting: Emergency Medicine

## 2019-01-01 DIAGNOSIS — Z760 Encounter for issue of repeat prescription: Secondary | ICD-10-CM | POA: Insufficient documentation

## 2019-01-01 DIAGNOSIS — J441 Chronic obstructive pulmonary disease with (acute) exacerbation: Secondary | ICD-10-CM | POA: Insufficient documentation

## 2019-01-01 DIAGNOSIS — R079 Chest pain, unspecified: Secondary | ICD-10-CM

## 2019-01-01 DIAGNOSIS — I10 Essential (primary) hypertension: Secondary | ICD-10-CM

## 2019-01-01 MED ORDER — TAMSULOSIN HCL 0.4 MG PO CAPS: 0.4000 mg | ORAL_CAPSULE | Freq: Every day | ORAL | 0 refills | Status: DC

## 2019-01-01 MED ORDER — PREDNISONE 20 MG PO TABS: 20.0000 mg | ORAL_TABLET | Freq: Two times a day (BID) | ORAL | 0 refills | Status: DC

## 2019-01-01 MED ORDER — DULOXETINE HCL 60 MG PO CPEP: 60.0000 mg | ORAL_CAPSULE | Freq: Every day | ORAL | 0 refills | Status: DC

## 2019-01-01 MED ORDER — BUDESONIDE-FORMOTEROL FUMARATE 80-4.5 MCG/ACT IN AERO: 2.0000 | INHALATION_SPRAY | Freq: Two times a day (BID) | RESPIRATORY_TRACT | 0 refills | Status: DC

## 2019-01-01 MED ORDER — IPRATROPIUM-ALBUTEROL 0.5-2.5 (3) MG/3ML IN SOLN
RESPIRATORY_TRACT | Status: AC
  Filled 2019-01-01: qty 3

## 2019-01-01 MED ORDER — IPRATROPIUM-ALBUTEROL 0.5-2.5 (3) MG/3ML IN SOLN
3.0000 mL | Freq: Once | RESPIRATORY_TRACT | Status: AC
  Administered 2019-01-01: 3 mL via RESPIRATORY_TRACT

## 2019-01-01 MED ORDER — LISINOPRIL 20 MG PO TABS: 20.0000 mg | ORAL_TABLET | Freq: Every day | ORAL | 0 refills | Status: DC

## 2019-01-01 MED ORDER — METFORMIN HCL 500 MG PO TABS: 500.0000 mg | ORAL_TABLET | Freq: Two times a day (BID) | ORAL | 0 refills | Status: DC

## 2019-01-01 NOTE — ED Notes (Signed)
Patient states that he has been out of his BP medications, metformin, and inhaler for almost a month due to currently living in a shelter.

## 2019-01-01 NOTE — ED Notes (Signed)
Patient able to ambulate independently  

## 2019-01-01 NOTE — Discharge Instructions (Signed)
Drink plenty of water I have refilled your medicine Follow up with a PCP
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
## 2019-01-01 NOTE — ED Triage Notes (Signed)
Pt presents to [facility] for assessment of cough, congestion x 2 weeks.  States he's beginning to have chest pain with cough and deep breaths, and if he laughs or tries to talk her coughs uncontrollably.

## 2019-02-25 ENCOUNTER — Encounter (HOSPITAL_COMMUNITY): Payer: Self-pay

## 2019-02-25 ENCOUNTER — Emergency Department (HOSPITAL_COMMUNITY): Payer: Self-pay

## 2019-02-25 ENCOUNTER — Other Ambulatory Visit: Payer: Self-pay

## 2019-02-25 ENCOUNTER — Emergency Department (HOSPITAL_COMMUNITY)
Admission: EM | Admit: 2019-02-25 | Discharge: 2019-02-26 | Disposition: A | Discharge status: Home / Self Care | Payer: Self-pay | Attending: Emergency Medicine | Admitting: Emergency Medicine

## 2019-02-25 DIAGNOSIS — F32A Depression, unspecified: Secondary | ICD-10-CM

## 2019-02-25 DIAGNOSIS — R0602 Shortness of breath: Secondary | ICD-10-CM | POA: Insufficient documentation

## 2019-02-25 DIAGNOSIS — I10 Essential (primary) hypertension: Secondary | ICD-10-CM | POA: Insufficient documentation

## 2019-02-25 DIAGNOSIS — Z79899 Other long term (current) drug therapy: Secondary | ICD-10-CM | POA: Insufficient documentation

## 2019-02-25 DIAGNOSIS — R062 Wheezing: Secondary | ICD-10-CM | POA: Insufficient documentation

## 2019-02-25 DIAGNOSIS — F1290 Cannabis use, unspecified, uncomplicated: Secondary | ICD-10-CM | POA: Insufficient documentation

## 2019-02-25 DIAGNOSIS — J449 Chronic obstructive pulmonary disease, unspecified: Secondary | ICD-10-CM | POA: Insufficient documentation

## 2019-02-25 DIAGNOSIS — F329 Major depressive disorder, single episode, unspecified: Secondary | ICD-10-CM | POA: Insufficient documentation

## 2019-02-25 DIAGNOSIS — F17210 Nicotine dependence, cigarettes, uncomplicated: Secondary | ICD-10-CM | POA: Insufficient documentation

## 2019-02-25 LAB — BASIC METABOLIC PANEL
ANION GAP: 10 (ref 5–15)
BUN: 11 mg/dL (ref 8–23)
CO2: 22 mmol/L (ref 22–32)
Calcium: 9.4 mg/dL (ref 8.9–10.3)
Chloride: 107 mmol/L (ref 98–111)
Creatinine, Ser: 0.89 mg/dL (ref 0.61–1.24)
GFR calc Af Amer: >60 mL/min (ref >60)
GFR calc non Af Amer: >60 mL/min (ref >60)
Glucose, Bld: 115 mg/dL — ABNORMAL HIGH (ref 70–99)
Potassium: 3.7 mmol/L (ref 3.5–5.1)
Sodium: 139 mmol/L (ref 135–145)

## 2019-02-25 LAB — CBC
HCT: 47 % (ref 39.0–52.0)
Hemoglobin: 15.3 g/dL (ref 13.0–17.0)
MCH: 28.4 pg (ref 26.0–34.0)
MCHC: 32.6 g/dL (ref 30.0–36.0)
MCV: 87.2 fL (ref 80.0–100.0)
PLATELETS: 192 10*3/uL (ref 150–400)
RBC: 5.39 MIL/uL (ref 4.22–5.81)
RDW: 13.5 % (ref 11.5–15.5)
WBC: 4.7 10*3/uL (ref 4.0–10.5)
nRBC: 0 % (ref 0.0–0.2)

## 2019-02-25 LAB — I-STAT TROPONIN, ED: Troponin i, poc: 0.01 ng/mL (ref 0.00–0.08)

## 2019-02-25 MED ORDER — SODIUM CHLORIDE 0.9% FLUSH: 3.0000 mL | Freq: Once | INTRAVENOUS | Status: DC

## 2019-02-25 NOTE — ED Triage Notes (Signed)
Pt reports SOB and chest pain "for a while" + ETOH. Resp e.u at this time.

## 2019-02-25 NOTE — ED Notes (Signed)
Pt reports being suicidal to tech during triage but when this RN asked pt if he is suicidal pt would not say yes or no. This RN repeatedly asked the pt and he would not answer.

## 2019-02-26 LAB — ETHANOL: Alcohol, Ethyl (B): <10 mg/dL (ref <10)

## 2019-02-26 MED ORDER — ALBUTEROL SULFATE HFA 108 (90 BASE) MCG/ACT IN AERS
2.0000 | INHALATION_SPRAY | RESPIRATORY_TRACT | Status: DC | PRN
  Administered 2019-02-26: 2 via RESPIRATORY_TRACT
  Filled 2019-02-26: qty 6.7

## 2019-02-26 MED ORDER — LISINOPRIL 20 MG PO TABS: 20.0000 mg | ORAL_TABLET | Freq: Every day | ORAL | Status: DC

## 2019-02-26 MED ORDER — ONDANSETRON HCL 4 MG PO TABS: 4.0000 mg | ORAL_TABLET | Freq: Three times a day (TID) | ORAL | Status: DC | PRN

## 2019-02-26 MED ORDER — DULOXETINE HCL 60 MG PO CPEP
60.0000 mg | ORAL_CAPSULE | Freq: Every day | ORAL | Status: DC
  Filled 2019-02-26: qty 1

## 2019-02-26 MED ORDER — TAMSULOSIN HCL 0.4 MG PO CAPS: 0.4000 mg | ORAL_CAPSULE | Freq: Every day | ORAL | Status: DC

## 2019-02-26 MED ORDER — ZOLPIDEM TARTRATE 5 MG PO TABS: 5.0000 mg | ORAL_TABLET | Freq: Every evening | ORAL | Status: DC | PRN

## 2019-02-26 MED ORDER — MOMETASONE FURO-FORMOTEROL FUM 100-5 MCG/ACT IN AERO
2.0000 | INHALATION_SPRAY | Freq: Two times a day (BID) | RESPIRATORY_TRACT | Status: DC
  Filled 2019-02-26: qty 8.8

## 2019-02-26 MED ORDER — ACETAMINOPHEN 325 MG PO TABS: 650.0000 mg | ORAL_TABLET | ORAL | Status: DC | PRN

## 2019-02-26 MED ORDER — NICOTINE 21 MG/24HR TD PT24: 21.0000 mg | MEDICATED_PATCH | Freq: Every day | TRANSDERMAL | Status: DC

## 2019-02-26 MED ORDER — IPRATROPIUM-ALBUTEROL 0.5-2.5 (3) MG/3ML IN SOLN
3.0000 mL | Freq: Once | RESPIRATORY_TRACT | Status: AC
  Administered 2019-02-26: 3 mL via RESPIRATORY_TRACT
  Filled 2019-02-26: qty 3

## 2019-02-26 MED ORDER — ALUM & MAG HYDROXIDE-SIMETH 200-200-20 MG/5ML PO SUSP: 30.0000 mL | Freq: Four times a day (QID) | ORAL | Status: DC | PRN

## 2019-02-26 MED ORDER — PREDNISONE 20 MG PO TABS
60.0000 mg | ORAL_TABLET | Freq: Once | ORAL | Status: AC
  Administered 2019-02-26: 60 mg via ORAL
  Filled 2019-02-26: qty 3

## 2019-02-26 NOTE — ED Notes (Signed)
This tech attempted to collect EtOH and was unsuccessful. Phlebotomy notified. Pt also informed he still needs to provide RUDS. Pt voiced frustration about "being here so long" and stated he was considering refusing further care.

## 2019-02-26 NOTE — Discharge Instructions (Signed)
Please use resources below to seek help with your condition.
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
## 2019-02-26 NOTE — ED Notes (Signed)
Patient verbalizes understanding of discharge instructions. Opportunity for questioning and answers were provided. Armband removed by staff, pt discharged from ED ambulatory to home.  

## 2019-02-26 NOTE — ED Notes (Signed)
Pt is requesting to talk to pt experience. This RN called to have them come speak to pt. Pt is upset about the [facility] care he is receiving

## 2019-02-26 NOTE — ED Notes (Signed)
Pt was given outpatient resources receive care and to obtain medications he had ran out of.

## 2019-02-26 NOTE — ED Provider Notes (Signed)
[facility] EMERGENCY DEPARTMENT Provider Note   CSN: [id number] Arrival date & time: 02/25/19  2127    History   Chief Complaint Chief Complaint  Patient presents with  . Shortness of Breath    HPI Temiloluwa Laredo is a 63 y.o. male.     The history is provided by the patient and medical records. No language interpreter was used.  Shortness of Breath     63 year old male with history of COPD, hypertension, depression presenting complaining of shortness of breath.  Patient report for the past 2 weeks he has had worsening shortness of breath and wheezing.  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  Complaining of bilateral pain to his legs from prolonged standing.  He also feels depressed, states he is currently without a place to stay after moving from [place] to be with his daughter.  He states that his daughter and her fianc is constantly fighting which stresses him out.  He also report any of his medication for the past few weeks since the moved.  He feels depressed without any true suicidal ideation.  He feels he does not have any friends and does not have a place to stay.  He denies any fever, productive cough, dysuria, focal numbness or weakness.  No prior history of PE.  No active chest pain.  Past Medical History:  Diagnosis Date  . COPD (chronic obstructive pulmonary disease) ([place])   . Depression   . Hypertension     Patient Active Problem List   Diagnosis Date Noted  . 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 No pertinent surgical history.      Home Medications    Prior to Admission medications   Medication Sig Start Date End Date Taking? Authorizing Provider  budesonide-formoterol (SYMBICORT) 80-4.5 MCG/ACT inhaler Inhale 2 puffs into the lungs 2 (two) times daily. 01/01/19   Raylene Everts, MD  DULoxetine (CYMBALTA) 60 MG  capsule Take 1 capsule (60 mg total) by mouth daily. 01/01/19   Raylene Everts, MD  lisinopril (PRINIVIL,ZESTRIL) 20 MG tablet Take 1 tablet (20 mg total) by mouth daily. 01/01/19   Raylene Everts, MD  metFORMIN (GLUCOPHAGE) 500 MG tablet Take 1 tablet (500 mg total) by mouth 2 (two) times daily with a meal. 01/01/19   Raylene Everts, MD  predniSONE (DELTASONE) 20 MG tablet Take 1 tablet (20 mg total) by mouth 2 (two) times daily with a meal. 01/01/19   Raylene Everts, MD  tamsulosin (FLOMAX) 0.4 MG CAPS capsule Take 1 capsule (0.4 mg total) by mouth daily. 01/01/19   Raylene Everts, MD    Family History No family history on file.  Social History Social History   Tobacco Use  . Smoking status: Current Some Day Smoker    Types: Cigarettes  . Smokeless tobacco: Never Used  Substance Use Topics  . Alcohol use: Yes    Comment: occasionally  . Drug use: Yes    Types: Marijuana     Allergies   Codeine   Review of Systems Review of Systems  Respiratory: Positive for shortness of breath.   All other systems reviewed and are negative.    Physical Exam Updated Vital Signs BP (!) 160/70   Pulse 67   Temp (!) 97.5 F (36.4 C) (Oral)   Resp 13   SpO2 96%   Physical Exam Vitals  signs and nursing note reviewed.  Constitutional:      General: He is not in acute distress.    Appearance: He is well-developed.  HENT:     Head: Atraumatic.  Eyes:     Conjunctiva/sclera: Conjunctivae normal.  Neck:     Musculoskeletal: Neck supple.  Pulmonary:     Breath sounds: Wheezing present.  Skin:    Findings: No rash.     Comments: On inspection of bilateral feet, evidence of onychomycosis as well as several calluses on the soles causing pain but no signs of infection.  DP pulse palpable   Neurological:     Mental Status: He is alert.  Psychiatric:        Mood and Affect: Mood is depressed.        Speech: Speech normal.        Behavior: Behavior is withdrawn.         Thought Content: Thought content is not paranoid. Thought content includes suicidal ideation. Thought content does not include homicidal ideation.      ED Treatments / Results  Labs (all labs ordered are listed, but only abnormal results are displayed) Labs Reviewed  BASIC METABOLIC PANEL - Abnormal; Notable for the following components:      Result Value   Glucose, Bld 115 (*)    All other components within normal limits  CBC  ETHANOL  RAPID URINE DRUG SCREEN, HOSP PERFORMED  I-STAT TROPONIN, ED    EKG None  ED ECG REPORT   Date: 02/26/2019  Rate: 62  Rhythm: normal sinus rhythm  QRS Axis: normal  Intervals: normal  ST/T Wave abnormalities: nonspecific ST changes  Conduction Disutrbances:none  Narrative Interpretation:   Old EKG Reviewed: none available  I have personally reviewed the EKG tracing and agree with the computerized printout as noted.   Radiology Dg Chest 2 View  Result Date: 02/25/2019 CLINICAL DATA:  63 year old male with shortness of breath and chest pain. EXAM: CHEST - 2 VIEW COMPARISON:  11/18/2018 and 10/25/2018 chest radiographs. FINDINGS: Tortuous thoracic aorta with stable contour since November. Other mediastinal contours are within normal limits. Visualized tracheal air column is within normal limits. Improved lung volumes. Both lungs appear clear. No pneumothorax or pleural effusion. Negative visible bowel gas pattern. No acute osseous abnormality identified. IMPRESSION: Chronically tortuous thoracic aorta. No acute cardiopulmonary abnormality. Electronically Signed   By: Genevie Ann M.D.   On: 02/25/2019 22:12    Procedures Procedures (including critical care time)  Medications Ordered in ED Medications  sodium chloride flush (NS) 0.9 % injection 3 mL (has no administration in time range)  acetaminophen (TYLENOL) tablet 650 mg (has no administration in time range)  zolpidem (AMBIEN) tablet 5 mg (has no administration in time range)  ondansetron  (ZOFRAN) tablet 4 mg (has no administration in time range)  alum & mag hydroxide-simeth (MAALOX/MYLANTA) 200-200-20 MG/5ML suspension 30 mL (has no administration in time range)  nicotine (NICODERM CQ - dosed in mg/24 hours) patch 21 mg (has no administration in time range)  DULoxetine (CYMBALTA) DR capsule 60 mg (has no administration in time range)  lisinopril (PRINIVIL,ZESTRIL) tablet 20 mg (has no administration in time range)  tamsulosin (FLOMAX) capsule 0.4 mg (has no administration in time range)  mometasone-formoterol (DULERA) 100-5 MCG/ACT inhaler 2 puff (2 puffs Inhalation Not Given 02/26/19 1315)  ipratropium-albuterol (DUONEB) 0.5-2.5 (3) MG/3ML nebulizer solution 3 mL (3 mLs Nebulization Given 02/26/19 0619)  predniSONE (DELTASONE) tablet 60 mg (60 mg Oral Given 02/26/19 0627)  Initial Impression / Assessment and Plan / ED Course  I have reviewed the triage vital signs and the nursing notes.  Pertinent labs & imaging results that were available during my care of the patient were reviewed by me and considered in my medical decision making (see chart for details).        BP (!) 160/70   Pulse 67   Temp (!) 97.5 F (36.4 C) (Oral)   Resp 13   SpO2 96%    Final Clinical Impressions(s) / ED Diagnoses   Final diagnoses:  Shortness of breath  Depression, unspecified depression type    ED Discharge Orders    None     6:18 AM Patient here with shortness of breath from prolonged standing COPD on exam patient does have some mild wheezes however he is in no acute respiratory discomfort.  Will provide prednisone as well as DuoNeb for symptomatic treatment.  He also report having social situation in which he does not have a place to stay, is feeling depressed with intermittent vague suicidal thoughts without a specific plan.  No HI, auditory or visual hallucination.  States he does not have any of his medication for the past several weeks.  He would like to talk to Research officer, political party which I can help arrange.  10:31 AM Social worker has seen pt but felt pt will benefit from TTS as his needs are more mental health related. TTS consulted.   1:02 PM Pt is voicing frustration due to inability to speak with a mental health specialist despite long wait.  I reassured pt we are actively trying to reach out to TTS for further evaluation.  Pt given meal tray.  Care discussed with Dr. Venora Maples.   1:38 PM TTS is available to evaluate pt, but pt does not want to talk to TTS and prefers to leave.  Will provide outpt resources and f/u with [facility] for further care.    Domenic Moras, PA-C 02/26/19 1339    Jola Schmidt, MD 02/27/19 1025

## 2019-02-26 NOTE — ED Notes (Addendum)
Pt was informed he needs to fully change out of the clothes he came in with and into the burgundy scrubs. Pt refused, stating that "this is bullshit" because he's been here since last night "and they ain't done nothing for me or my pain, I'm getting sicker just being here."

## 2019-03-12 ENCOUNTER — Observation Stay (HOSPITAL_COMMUNITY)
Admission: EM | Admit: 2019-03-12 | Discharge: 2019-03-13 | Disposition: A | Discharge status: Home / Self Care | Payer: Self-pay | Attending: Family Medicine | Admitting: Family Medicine

## 2019-03-12 ENCOUNTER — Other Ambulatory Visit: Payer: Self-pay

## 2019-03-12 ENCOUNTER — Emergency Department (HOSPITAL_COMMUNITY): Payer: Self-pay

## 2019-03-12 ENCOUNTER — Encounter (HOSPITAL_COMMUNITY): Payer: Self-pay | Admitting: *Deleted

## 2019-03-12 DIAGNOSIS — J449 Chronic obstructive pulmonary disease, unspecified: Secondary | ICD-10-CM | POA: Insufficient documentation

## 2019-03-12 DIAGNOSIS — E119 Type 2 diabetes mellitus without complications: Secondary | ICD-10-CM | POA: Insufficient documentation

## 2019-03-12 DIAGNOSIS — R0602 Shortness of breath: Secondary | ICD-10-CM

## 2019-03-12 DIAGNOSIS — R072 Precordial pain: Secondary | ICD-10-CM

## 2019-03-12 DIAGNOSIS — F329 Major depressive disorder, single episode, unspecified: Secondary | ICD-10-CM | POA: Insufficient documentation

## 2019-03-12 DIAGNOSIS — F17210 Nicotine dependence, cigarettes, uncomplicated: Secondary | ICD-10-CM | POA: Insufficient documentation

## 2019-03-12 DIAGNOSIS — R748 Abnormal levels of other serum enzymes: Secondary | ICD-10-CM | POA: Insufficient documentation

## 2019-03-12 DIAGNOSIS — Z7984 Long term (current) use of oral hypoglycemic drugs: Secondary | ICD-10-CM | POA: Insufficient documentation

## 2019-03-12 DIAGNOSIS — Z885 Allergy status to narcotic agent status: Secondary | ICD-10-CM | POA: Insufficient documentation

## 2019-03-12 DIAGNOSIS — E785 Hyperlipidemia, unspecified: Secondary | ICD-10-CM | POA: Insufficient documentation

## 2019-03-12 DIAGNOSIS — Z79899 Other long term (current) drug therapy: Secondary | ICD-10-CM | POA: Insufficient documentation

## 2019-03-12 DIAGNOSIS — F4322 Adjustment disorder with anxiety: Secondary | ICD-10-CM | POA: Insufficient documentation

## 2019-03-12 DIAGNOSIS — I10 Essential (primary) hypertension: Secondary | ICD-10-CM | POA: Insufficient documentation

## 2019-03-12 DIAGNOSIS — R079 Chest pain, unspecified: Secondary | ICD-10-CM | POA: Diagnosis present

## 2019-03-12 DIAGNOSIS — F32A Depression, unspecified: Secondary | ICD-10-CM

## 2019-03-12 DIAGNOSIS — R0600 Dyspnea, unspecified: Principal | ICD-10-CM | POA: Insufficient documentation

## 2019-03-12 DIAGNOSIS — F419 Anxiety disorder, unspecified: Secondary | ICD-10-CM

## 2019-03-12 DIAGNOSIS — R262 Difficulty in walking, not elsewhere classified: Secondary | ICD-10-CM | POA: Insufficient documentation

## 2019-03-12 LAB — CBC WITH DIFFERENTIAL/PLATELET
Abs Immature Granulocytes: 0.01 10*3/uL (ref 0.00–0.07)
Basophils Absolute: 0 10*3/uL (ref 0.0–0.1)
Basophils Relative: 1 %
Eosinophils Absolute: 0.1 10*3/uL (ref 0.0–0.5)
Eosinophils Relative: 3 %
HCT: 49.1 % (ref 39.0–52.0)
Hemoglobin: 16 g/dL (ref 13.0–17.0)
Immature Granulocytes: 0 %
Lymphocytes Relative: 41 %
Lymphs Abs: 1.5 10*3/uL (ref 0.7–4.0)
MCH: 29 pg (ref 26.0–34.0)
MCHC: 32.6 g/dL (ref 30.0–36.0)
MCV: 88.9 fL (ref 80.0–100.0)
Monocytes Absolute: 0.4 10*3/uL (ref 0.1–1.0)
Monocytes Relative: 11 %
NEUTROS PCT: 44 %
Neutro Abs: 1.7 10*3/uL (ref 1.7–7.7)
Platelets: 173 10*3/uL (ref 150–400)
RBC: 5.52 MIL/uL (ref 4.22–5.81)
RDW: 13.9 % (ref 11.5–15.5)
WBC: 3.8 10*3/uL — AB (ref 4.0–10.5)
nRBC: 0 % (ref 0.0–0.2)

## 2019-03-12 LAB — I-STAT TROPONIN, ED
TROPONIN I, POC: 0.08 ng/mL (ref 0.00–0.08)
Troponin i, poc: 0.1 ng/mL (ref 0.00–0.08)

## 2019-03-12 LAB — TROPONIN I
Troponin I: <0.03 ng/mL (ref <0.03)
Troponin I: <0.03 ng/mL (ref <0.03)

## 2019-03-12 LAB — COMPREHENSIVE METABOLIC PANEL
ALT: 67 U/L — ABNORMAL HIGH (ref 0–44)
AST: 66 U/L — AB (ref 15–41)
Albumin: 3.9 g/dL (ref 3.5–5.0)
Alkaline Phosphatase: 65 U/L (ref 38–126)
Anion gap: 10 (ref 5–15)
BUN: 9 mg/dL (ref 8–23)
CHLORIDE: 107 mmol/L (ref 98–111)
CO2: 23 mmol/L (ref 22–32)
Calcium: 9.2 mg/dL (ref 8.9–10.3)
Creatinine, Ser: 0.87 mg/dL (ref 0.61–1.24)
GFR calc Af Amer: >60 mL/min (ref >60)
Glucose, Bld: 90 mg/dL (ref 70–99)
Potassium: 3.9 mmol/L (ref 3.5–5.1)
Sodium: 140 mmol/L (ref 135–145)
Total Bilirubin: 1 mg/dL (ref 0.3–1.2)
Total Protein: 7.2 g/dL (ref 6.5–8.1)

## 2019-03-12 LAB — TSH: TSH: 0.608 u[IU]/mL (ref 0.350–4.500)

## 2019-03-12 LAB — HEMOGLOBIN A1C
Hgb A1c MFr Bld: 6.3 % — ABNORMAL HIGH (ref 4.8–5.6)
Mean Plasma Glucose: 134.11 mg/dL

## 2019-03-12 LAB — BRAIN NATRIURETIC PEPTIDE: B Natriuretic Peptide: 55.3 pg/mL (ref 0.0–100.0)

## 2019-03-12 MED ORDER — AEROCHAMBER PLUS FLO-VU MISC
1.0000 | Freq: Once | Status: DC
  Filled 2019-03-12: qty 1

## 2019-03-12 MED ORDER — ALBUTEROL SULFATE (2.5 MG/3ML) 0.083% IN NEBU: 3.0000 mL | INHALATION_SOLUTION | RESPIRATORY_TRACT | Status: DC | PRN

## 2019-03-12 MED ORDER — ALBUTEROL SULFATE HFA 108 (90 BASE) MCG/ACT IN AERS
2.0000 | INHALATION_SPRAY | RESPIRATORY_TRACT | Status: DC | PRN
  Administered 2019-03-12: 2 via RESPIRATORY_TRACT
  Filled 2019-03-12: qty 6.7

## 2019-03-12 MED ORDER — ACETAMINOPHEN 325 MG PO TABS: 650.0000 mg | ORAL_TABLET | ORAL | Status: DC | PRN

## 2019-03-12 MED ORDER — DULOXETINE HCL 60 MG PO CPEP
60.0000 mg | ORAL_CAPSULE | Freq: Every day | ORAL | Status: DC
  Administered 2019-03-12 – 2019-03-13 (×2): 60 mg via ORAL
  Filled 2019-03-12 (×2): qty 1

## 2019-03-12 MED ORDER — ENOXAPARIN SODIUM 30 MG/0.3ML ~~LOC~~ SOLN: 30.0000 mg | SUBCUTANEOUS | Status: DC

## 2019-03-12 MED ORDER — MORPHINE SULFATE (PF) 4 MG/ML IV SOLN: 4.0000 mg | INTRAVENOUS | Status: DC | PRN

## 2019-03-12 MED ORDER — MOMETASONE FURO-FORMOTEROL FUM 100-5 MCG/ACT IN AERO
2.0000 | INHALATION_SPRAY | Freq: Two times a day (BID) | RESPIRATORY_TRACT | Status: DC
  Administered 2019-03-12 – 2019-03-13 (×2): 2 via RESPIRATORY_TRACT
  Filled 2019-03-12: qty 8.8

## 2019-03-12 MED ORDER — PREDNISONE 20 MG PO TABS
60.0000 mg | ORAL_TABLET | Freq: Once | ORAL | Status: AC
  Administered 2019-03-12: 60 mg via ORAL
  Filled 2019-03-12: qty 3

## 2019-03-12 MED ORDER — NITROGLYCERIN 0.4 MG SL SUBL: 0.4000 mg | SUBLINGUAL_TABLET | SUBLINGUAL | Status: DC | PRN

## 2019-03-12 MED ORDER — ONDANSETRON HCL 4 MG/2ML IJ SOLN: 4.0000 mg | Freq: Once | INTRAMUSCULAR | Status: DC

## 2019-03-12 MED ORDER — TAMSULOSIN HCL 0.4 MG PO CAPS
0.4000 mg | ORAL_CAPSULE | Freq: Every day | ORAL | Status: DC
  Administered 2019-03-12 – 2019-03-13 (×2): 0.4 mg via ORAL
  Filled 2019-03-12 (×2): qty 1

## 2019-03-12 MED ORDER — LISINOPRIL 10 MG PO TABS
20.0000 mg | ORAL_TABLET | Freq: Every day | ORAL | Status: DC
  Administered 2019-03-12 – 2019-03-13 (×2): 20 mg via ORAL
  Filled 2019-03-12 (×3): qty 2
  Filled 2019-03-12 (×2): qty 1

## 2019-03-12 MED ORDER — ASPIRIN EC 81 MG PO TBEC
81.0000 mg | DELAYED_RELEASE_TABLET | Freq: Every day | ORAL | Status: DC
  Administered 2019-03-13: 81 mg via ORAL
  Filled 2019-03-12: qty 1

## 2019-03-12 MED ORDER — ONDANSETRON HCL 4 MG/2ML IJ SOLN: 4.0000 mg | Freq: Four times a day (QID) | INTRAMUSCULAR | Status: DC | PRN

## 2019-03-12 NOTE — ED Notes (Signed)
Pt refusing to wear gown.

## 2019-03-12 NOTE — ED Notes (Signed)
Pt refused EKG.

## 2019-03-12 NOTE — ED Notes (Signed)
Pt refusing vitals Pt refusing to wear gown, yelling at staff

## 2019-03-12 NOTE — ED Notes (Signed)
TTS machine at bedside. 
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
## 2019-03-12 NOTE — Plan of Care (Signed)

## 2019-03-12 NOTE — ED Notes (Addendum)
ED TO INPATIENT HANDOFF REPORT  ED Nurse Name and Phone #:  Gwynn Burly Name/Age/Gender Devin Myers 63 y.o. male Room/Bed: 024C/024C  Code Status   Code Status: Prior  Home/SNF/Other Home Patient oriented to: self, place, time and situation Is this baseline? Yes   Triage Complete: Triage complete  Chief Complaint SOB, Chest Pain  Triage Note Pt here form work Engineer, building services) with c/o cough x2 weeks and chest pressure.  Pt states he was seen last week at [facility] with similar complaints.  Pt given 324 ASA by EMS.  Pt states chest pressure is worse upon inspiration.   EMS stated that attempted 2 IV insertions but patient kept moving and screaming.      Allergies Allergies  Allergen Reactions  . Codeine Nausea And Vomiting    Level of Care/Admitting Diagnosis ED Disposition    ED Disposition Condition [facility] Area: [place] [100100]  Level of Care: Telemetry Medical [104]  Diagnosis: Chest pain [237628]  Admitting Physician: Matilde Haymaker [[id number]]  Attending Physician: Owens Shark, CARINA Jerilynn Mages [[id number]]  PT Class (Do Not Modify): Observation [104]  PT Acc Code (Do Not Modify): Observation [10022]       B Medical/Surgery History Past Medical History:  Diagnosis Date  . COPD (chronic obstructive pulmonary disease) ([place])   . Depression   . Hypertension    No past surgical history on file.   A IV Location/Drains/Wounds Patient Lines/Drains/Airways Status   Active Line/Drains/Airways    None          Intake/Output Last 24 hours No intake or output data in the 24 hours ending 03/12/19 1558  Labs/Imaging Results for orders placed or performed during the hospital encounter of 03/12/19 (from the past 48 hour(s))  CBC with Differential/Platelet     Status: Abnormal   Collection Time: 03/12/19  9:17 AM  Result Value Ref Range   WBC 3.8 (L) 4.0 - 10.5 K/uL   RBC 5.52 4.22 - 5.81 MIL/uL   Hemoglobin 16.0 13.0 - 17.0 g/dL   HCT  49.1 39.0 - 52.0 %   MCV 88.9 80.0 - 100.0 fL   MCH 29.0 26.0 - 34.0 pg   MCHC 32.6 30.0 - 36.0 g/dL   RDW 13.9 11.5 - 15.5 %   Platelets 173 150 - 400 K/uL   nRBC 0.0 0.0 - 0.2 %   Neutrophils Relative % 44 %   Neutro Abs 1.7 1.7 - 7.7 K/uL   Lymphocytes Relative 41 %   Lymphs Abs 1.5 0.7 - 4.0 K/uL   Monocytes Relative 11 %   Monocytes Absolute 0.4 0.1 - 1.0 K/uL   Eosinophils Relative 3 %   Eosinophils Absolute 0.1 0.0 - 0.5 K/uL   Basophils Relative 1 %   Basophils Absolute 0.0 0.0 - 0.1 K/uL   Immature Granulocytes 0 %   Abs Immature Granulocytes 0.01 0.00 - 0.07 K/uL    Comment: Performed at [facility], [address] [place], [place] [address]  Comprehensive metabolic panel     Status: Abnormal   Collection Time: 03/12/19  9:17 AM  Result Value Ref Range   Sodium 140 135 - 145 mmol/L   Potassium 3.9 3.5 - 5.1 mmol/L   Chloride 107 98 - 111 mmol/L   CO2 23 22 - 32 mmol/L   Glucose, Bld 90 70 - 99 mg/dL   BUN 9 8 - 23 mg/dL   Creatinine, Ser 0.87 0.61 - 1.24  mg/dL   Calcium 9.2 8.9 - 10.3 mg/dL   Total Protein 7.2 6.5 - 8.1 g/dL   Albumin 3.9 3.5 - 5.0 g/dL   AST 66 (H) 15 - 41 U/L   ALT 67 (H) 0 - 44 U/L   Alkaline Phosphatase 65 38 - 126 U/L   Total Bilirubin 1.0 0.3 - 1.2 mg/dL   GFR calc non Af Amer >60 >60 mL/min   GFR calc Af Amer >60 >60 mL/min   Anion gap 10 5 - 15    Comment: Performed at [facility] [address] [place], [place] [address]  I-stat troponin, ED     Status: None   Collection Time: 03/12/19  9:43 AM  Result Value Ref Range   Troponin i, poc 0.08 0.00 - 0.08 ng/mL   Comment 3            Comment: Due to the release kinetics of cTnI, a negative result within the first hours of the onset of symptoms does not rule out myocardial infarction with certainty. If myocardial infarction is still suspected, repeat the test at appropriate intervals.   I-stat troponin, ED     Status: Abnormal   Collection Time: 03/12/19  2:09 PM   Result Value Ref Range   Troponin i, poc 0.10 (HH) 0.00 - 0.08 ng/mL   Comment NOTIFIED PHYSICIAN    Comment 3            Comment: Due to the release kinetics of cTnI, a negative result within the first hours of the onset of symptoms does not rule out myocardial infarction with certainty. If myocardial infarction is still suspected, repeat the test at appropriate intervals.    Dg Chest Portable 1 View  Result Date: 03/12/2019 CLINICAL DATA:  Cough and shortness of breath EXAM: PORTABLE CHEST 1 VIEW COMPARISON:  February 25, 2019 FINDINGS: There is no edema or consolidation. The heart size and pulmonary vascularity are normal. No adenopathy. No bone lesions. IMPRESSION: No edema or consolidation. Electronically Signed   By: Lowella Grip III M.D.   On: 03/12/2019 09:35    Pending Labs Unresulted Labs (From admission, onward)   None      Vitals/Pain Today's Vitals   03/12/19 0911 03/12/19 0912 03/12/19 0913 03/12/19 0915  BP:    (!) 114/47  Pulse: 64   66  Resp: (!) 24   17  Temp: 97.8 F (36.6 C)     TempSrc: Oral     SpO2: 98%   98%  Height:   5\' 11"  (1.803 m)   PainSc:  10-Worst pain ever      Isolation Precautions No active isolations  Medications Medications  albuterol (PROVENTIL HFA;VENTOLIN HFA) 108 (90 Base) MCG/ACT inhaler 2 puff (2 puffs Inhalation Given 03/12/19 1038)  aerochamber plus with mask device 1 each (1 each Other Not Given 03/12/19 1557)  morphine 4 MG/ML injection 4 mg (has no administration in time range)  ondansetron (ZOFRAN) injection 4 mg (has no administration in time range)  predniSONE (DELTASONE) tablet 60 mg (60 mg Oral Given 03/12/19 1038)    Mobility walks Moderate fall risk   Focused Assessments Pulmonary Assessment Hand    R Recommendations: See Admitting Provider Note  Report given to:   Additional Notes:   Patient states that the pressure has been fairly constant over the 2 weeks -- although it seems to be worse during  exertion at work.  Cough is productive of green sputum and is not  getting better or worse.  He denies any fevers, nasal congestion, sore throat.  No abdominal pain, nausea, vomiting, diarrhea.  Chest pressure does not radiate.  It is not made worse with activity.  Patient states that he was seen in the emergency department about 2 weeks ago and was treated poorly.  He seems fixated on this during interview.

## 2019-03-12 NOTE — Progress Notes (Signed)
FMTS Attending Brief Note: Dorris Singh, MD  Personal pager:  [phone] [place] Service Pager:  [phone] Brief note, will sign full history and physical when available.  Patient seen and examined at 1700 today in Emergency Department.   63 year old with history of mood disorder, alcohol abuse, and tobacco abuse presenting with chest pain. On my evaluation, this has resolved. He primarily complains of difficulty with communicating with his friends. He is on FaceTime much of the evaluation. Reviewed labs, imaging, EKG. Will plan to admit to [facility]. Will trend troponin, EKG. Heart score is two, symptoms are atypical and difficult for patient to describe at this time. Suspect chest pain may be multifactorial related to anxiety, possibly GI. Pending troponins, EKG, and further evaluation with formally consult Cardiology.

## 2019-03-12 NOTE — ED Triage Notes (Addendum)
Pt here form work Engineer, building services) with c/o cough x2 weeks and chest pressure.  Pt states he was seen last week at [facility] with similar complaints.  Pt given 324 ASA by EMS.  Pt states chest pressure is worse upon inspiration.   EMS stated that attempted 2 IV insertions but patient kept moving and screaming.

## 2019-03-12 NOTE — ED Notes (Signed)
Pt refusing lab draw until he speaks with the EDP.

## 2019-03-12 NOTE — ED Provider Notes (Signed)
[facility] EMERGENCY DEPARTMENT Provider Note   CSN: [id number] Arrival date & time: 03/12/19  0901    History   Chief Complaint No chief complaint on file.   HPI Devin Myers is a 63 y.o. male.     Patient with history of COPD, depression, HTN presents the emergency department today with complaint of ongoing chest pressure, cough, and shortness of breath.  Patient states that he recently relocated from [place] and is now living in [place].  He works at [employer] as a Clinical research associate.  He reports that his manager called an ambulance for him last night because of symptoms while working.  Patient states that the pressure has been fairly constant over the 2 weeks -- although it seems to be worse during exertion at work.  Cough is productive of green sputum and is not getting better or worse.  He denies any fevers, nasal congestion, sore throat.  No abdominal pain, nausea, vomiting, diarrhea.  Chest pressure does not radiate.  It is not made worse with activity.  Patient states that he was seen in the emergency department about 2 weeks ago and was treated poorly.  He seems fixated on this during interview.  Per notes, TTS consult was ordered due to depression, suicidal thoughts, but patient became upset and declined after being made to change into scrubs.  Patient denies risk factors for pulmonary embolism including: unilateral leg swelling, history of DVT/PE/other blood clots, use of exogenous hormones, recent immobilizations, recent surgery, recent travel (>4hr segment), malignancy, hemoptysis. States he is out of all medications and does not have a PCP here.       Past Medical History:  Diagnosis Date  . COPD (chronic obstructive pulmonary disease) ([place])   . Depression   . Hypertension     Patient Active Problem List   Diagnosis Date Noted  . 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      Home Medications     Prior to Admission medications   Medication Sig Start Date End Date Taking? Authorizing Provider  budesonide-formoterol (SYMBICORT) 80-4.5 MCG/ACT inhaler Inhale 2 puffs into the lungs 2 (two) times daily. 01/01/19   Raylene Everts, MD  DULoxetine (CYMBALTA) 60 MG capsule Take 1 capsule (60 mg total) by mouth daily. 01/01/19   Raylene Everts, MD  lisinopril (PRINIVIL,ZESTRIL) 20 MG tablet Take 1 tablet (20 mg total) by mouth daily. 01/01/19   Raylene Everts, MD  metFORMIN (GLUCOPHAGE) 500 MG tablet Take 1 tablet (500 mg total) by mouth 2 (two) times daily with a meal. 01/01/19   Raylene Everts, MD  predniSONE (DELTASONE) 20 MG tablet Take 1 tablet (20 mg total) by mouth 2 (two) times daily with a meal. 01/01/19   Raylene Everts, MD  tamsulosin (FLOMAX) 0.4 MG CAPS capsule Take 1 capsule (0.4 mg total) by mouth daily. 01/01/19   Raylene Everts, MD    Family History No family history on file.  Social History Social History   Tobacco Use  . Smoking status: Current Some Day Smoker    Types: Cigarettes  . Smokeless tobacco: Never Used  Substance Use Topics  . Alcohol use: Yes    Comment: occasionally  . Drug use: Yes    Types: Marijuana     Allergies   Codeine   Review of Systems Review of Systems  Constitutional: Negative for diaphoresis and fever.  Eyes: Negative for redness.  Respiratory:  Positive for cough and shortness of breath. Negative for wheezing.   Cardiovascular: Positive for chest pain. Negative for palpitations and leg swelling.  Gastrointestinal: Negative for abdominal pain, nausea and vomiting.  Genitourinary: Negative for dysuria.  Musculoskeletal: Negative for back pain and neck pain.  Skin: Negative for rash.  Neurological: Negative for syncope and light-headedness.  Psychiatric/Behavioral: The patient is not nervous/anxious.      Physical Exam Updated Vital Signs BP (!) 114/47   Pulse 66   Temp 97.8 F (36.6 C) (Oral)   Resp 17    Ht 5\' 11"  (1.803 m)   SpO2 98%   BMI 22.04 kg/m   Physical Exam Vitals signs and nursing note reviewed.  Constitutional:      Appearance: He is well-developed. He is not diaphoretic.  HENT:     Head: Normocephalic and atraumatic.     Mouth/Throat:     Mouth: Mucous membranes are not dry.  Eyes:     Conjunctiva/sclera: Conjunctivae normal.  Neck:     Musculoskeletal: Normal range of motion and neck supple. No muscular tenderness.     Vascular: Normal carotid pulses. No carotid bruit or JVD.     Trachea: Trachea normal. No tracheal deviation.  Cardiovascular:     Rate and Rhythm: Normal rate and regular rhythm.     Pulses: No decreased pulses.     Heart sounds: Normal heart sounds, S1 normal and S2 normal. Heart sounds not distant. No murmur.  Pulmonary:     Effort: Pulmonary effort is normal. No respiratory distress.     Breath sounds: Normal breath sounds. No wheezing.  Chest:     Chest wall: No tenderness.  Abdominal:     General: Bowel sounds are normal.     Palpations: Abdomen is soft.     Tenderness: There is no abdominal tenderness. There is no guarding or rebound.  Musculoskeletal:        General: No swelling or tenderness.     Right lower leg: No edema.     Left lower leg: No edema.  Skin:    General: Skin is warm and dry.     Coloration: Skin is not pale.  Neurological:     Mental Status: He is alert.  Psychiatric:        Mood and Affect: Mood is depressed.      ED Treatments / Results  Labs (all labs ordered are listed, but only abnormal results are displayed) Labs Reviewed  CBC WITH DIFFERENTIAL/PLATELET - Abnormal; Notable for the following components:      Result Value   WBC 3.8 (*)    All other components within normal limits  COMPREHENSIVE METABOLIC PANEL - Abnormal; Notable for the following components:   AST 66 (*)    ALT 67 (*)    All other components within normal limits  I-STAT TROPONIN, ED - Abnormal; Notable for the following  components:   Troponin i, poc 0.10 (*)    All other components within normal limits  I-STAT TROPONIN, ED    EKG EKG Interpretation  Date/Time:  Thursday March 12 2019 09:13:31 EDT Ventricular Rate:  72 PR Interval:    QRS Duration: 107 QT Interval:  397 QTC Calculation: 410 R Axis:   4 Text Interpretation:  Sinus rhythm Multiple premature complexes, vent & supraven Probable anteroseptal infarct, old Borderline T abnormalities, inferior leads Interpretation limited secondary to artifact otherwise similar to Feb 25 2019 Confirmed by Sherwood Gambler [phone]) on 03/12/2019 9:50:05 AM  Radiology Dg Chest Portable 1 View  Result Date: 03/12/2019 CLINICAL DATA:  Cough and shortness of breath EXAM: PORTABLE CHEST 1 VIEW COMPARISON:  February 25, 2019 FINDINGS: There is no edema or consolidation. The heart size and pulmonary vascularity are normal. No adenopathy. No bone lesions. IMPRESSION: No edema or consolidation. Electronically Signed   By: Lowella Grip III M.D.   On: 03/12/2019 09:35    Procedures Procedures (including critical care time)  Medications Ordered in ED Medications  albuterol (PROVENTIL HFA;VENTOLIN HFA) 108 (90 Base) MCG/ACT inhaler 2 puff (2 puffs Inhalation Given 03/12/19 1038)  aerochamber plus with mask device 1 each (has no administration in time range)  morphine 4 MG/ML injection 4 mg (has no administration in time range)  ondansetron (ZOFRAN) injection 4 mg (has no administration in time range)  predniSONE (DELTASONE) tablet 60 mg (60 mg Oral Given 03/12/19 1038)     Initial Impression / Assessment and Plan / ED Course  I have reviewed the triage vital signs and the nursing notes.  Pertinent labs & imaging results that were available during my care of the patient were reviewed by me and considered in my medical decision making (see chart for details).        Patient seen and examined with droplet and contact precautions. Work-up initiated. Medications  ordered. EKG reviewed. Will treat COPD exacerbation.   Vital signs reviewed and are as follows: BP (!) 114/47   Pulse 66   Temp 97.8 F (36.6 C) (Oral)   Resp 17   Ht 5\' 11"  (1.803 m)   SpO2 98%   BMI 22.04 kg/m   Initial trop 0.08. Will obtain trend. CXR reviewed personally.   Patient offered TTS consultation while waiting for delta troponin and agrees, stating he needs to talk with someone.   3:23 PM TTS evaluation completed.  Inpatient admission is not recommended at this time patient is to [facility] with [facility].  Repeat troponin was 0.10.  Patient continues to complain of some pressure in his chest.  Will recheck EKG.  I discussed these results with the patient.  Offered admission.  Discussed that if he wanted to leave, would have him sign out [place].  He states that while he is worried about his living situation, he would worry too much if he went home and is willing to stay at this point.  3:26 PM Spoke with [facility] who will see for admit.   Final Clinical Impressions(s) / ED Diagnoses   Final diagnoses:  Precordial pain  Shortness of breath  Depression, unspecified depression type   Admit with ongoing CP, moderate risk HEART score.   ED Discharge Orders    None       Carlisle Cater, Hershal Coria 03/12/19 1529    Sherwood Gambler, MD 03/12/19 [phone]

## 2019-03-12 NOTE — ED Notes (Signed)
Pt refusing to discuss symptoms Pt repeatedly wants to discuss the poor care and treatment he received last week when he was here  Pt yelling and stating "go ahead and call security on me like they did last time" This RN attempted to explain to patient this is a different visit and we are just trying to help you
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
## 2019-03-12 NOTE — H&P (Addendum)
[facility] Admission History and Physical Service Pager: [phone]  Patient name: Devin Myers Medical record number: [id number] Date of birth: [birth year] Age: 63 y.o. Gender: male  Primary Care Provider: System, Pcp Not In Consultants: none Code Status: full  Chief Complaint: Chest Pain  Assessment and Plan: Devin Myers is a 63 y.o. male presenting with chest pressure and shortness of breath.  His past medical history is significant for diabetes, hypertension, depression/anxiety.  Dyspnea concerning for cardiac etiology, ACS rule out He presents with 2 to 3 days of episodic dyspnea, most frequently when he is anxious/agitated though also with exertion.  Chest pressure noted without radiation radiation.  Medical history is significant for diabetes, hypertension and "mini strokes".  Physical exam was notable for chest wall tenderness to palpation similar to the pressure that he has felt in his chest.  Admission vitals were notable for hypertension up to 161/112.  Admission labs were remarkable for BNP 55, point-of-care troponin of 0.08> 0.10, AST 66 ALT 67, WBC 3.8.  EKG was difficult to assess and a repeat was ordered.  Chest x-ray was notable for a tortuous aorta.  Heart score:4.  Differential at this time includes an STEMI, unstable angina, anxiety, GERD.  His history sounds suspicious for anxiety related chest pressure/dyspnea.  Given his history of diabetes and hypertension in addition to clear evidence of vascular disease with a previous CT head showing ischemic changes, chest x-ray with a tortuous aorta and blood pressures consistently elevated, it seems prudent to rule out a cardiac etiology for this chest pain.  PE considered but Wells Score of 0 so no d-dimer ordered. Vitals stable with no new oxygen requirement. We will observe overnight. -Admit to observation, attending Dr. Owens Shark -Discussed with cardiology in a.m. -Telemetry -Trend troponin -Follow-up  echo -Risk stratification labs -Nitroglycerin as needed -Monitor vitals -Zofran PRN  Hypertension He has known history of hypertension but has not taken any medication at home for the past 2 to 3 months.  That was on admission remarkable for systolic pressures up to 277 and diastolic pressures up to 824.  He is previously been medicated with lisinopril 25 mg daily. -Restart lisinopril 20 mg daily  Elevated liver enzymes Admission labs reveal AST 66, ALT 67.  He has no known history of cirrhosis or liver injury.  He denies IV drug use.  No clear etiology at this time.  We will continue to monitor. -Follow-up a.m. CMP  Diabetes He reports a history of diabetes which is previously been medicated with metformin.  A1c on admission is 6.3 with blood glucose of 90. -Monitor BG with BMP  Anxiety/depression Is a known history of anxiety and depression with 1 previous suicide attempt several years ago.  He was assessed by telemedicine behavioral health in the ED who diagnosed him with adjustment disorder due to recent stressors related to recently moving from [place].  Advised following up with [facility] outpatient.  He has previously taken duloxetine 60 mg daily. -Restart duloxetine 60 mg daily  COPD He reports a history of COPD and reports that his current chest pressure reminds him of his COPD exacerbation.  Is currently demonstrating normal respiratory effort on room air and saturating well.  Shows good air movement on exam without evidence of wheezing. -Albuterol inhaler as needed -Restart Dulera -Follow-up CBC with differential in a.m. to assess if inhaled steroids are necessary   FEN/GI: Heart healthy diet Prophylaxis: Lovenox  Disposition: Possible DC home 3/27  History of Present Illness:  Devin Myers is a 63 y.o. male presenting with chest pressure and shortness of breath.  His past medical history is significant for diabetes, hypertension, depression/anxiety.  This has been  ongoing for 2-3 days. He didn't come sooner because he has just started a new job and he was scared of losing it.he reports that he has occasional episodes of shortness of breath the last for at least several minutes.  These episodes of dyspnea are sometimes accompanied by a chest pressure in the center of his chest that does not radiate to arm or jaw.  He mentioned that he also experiences numbness of both hands during these episodes.  He notices that he improves when he is able to sit and focus on his breathing.  He has not noticed any provement in these episodes related to medication.  Seems to happen most when he is feeling anxious or irritated but he also notices it at work on exertion.  From his perspective, this feels most similar to previous COPD exacerbation.  Most recently, he experienced an episode at work on the evening of 3/25.  His coworkers called an ambulance for him and he was brought to the emergency room.  He hasn't been taking any medication lately. He has recently relocated from [place], [place]. He reports being upset lately because of losing shoes and other valuables. He states he has been told in the past he had "three mini strokes" but has never been told he has had a heart attack. No stents or previous caths.  He does have several chronic medical conditions but he has not taken any medication in the past through 2 to 3 months due to expenses.  On review of systems, he reported he has been afebrile, cough with sputum production for the past week, blurred vision and seeing spots for the past month, soft stools (last stool 3/24).  He reports having blurred vision and seeing spots for the past 2 months. He also reports "hearing voices" when he is alone.  Review Of Systems: Per HPI with the following additions:  Review of Systems  Constitutional: Negative for fever and malaise/fatigue.  HENT: Positive for hearing loss. Negative for sore throat.   Eyes: Positive for blurred vision.   Respiratory: Positive for cough, sputum production and shortness of breath.   Cardiovascular: Positive for chest pain. Negative for palpitations.  Gastrointestinal: Positive for diarrhea and nausea. Negative for abdominal pain and vomiting.  Genitourinary: Positive for frequency. Negative for dysuria and urgency.  Skin: Negative for rash.  Neurological: Positive for tremors and headaches.    Patient Active Problem List   Diagnosis Date Noted  . Adjustment disorder with disturbance of conduct 10/26/2018    Past Medical History: Past Medical History:  Diagnosis Date  . COPD (chronic obstructive pulmonary disease) ([place])   . Depression   . Hypertension     Past Surgical History: No past surgical history on file.   Social History: Social History   Tobacco Use  . Smoking status: Current Some Day Smoker    Types: Cigarettes  . Smokeless tobacco: Never Used  Substance Use Topics  . Alcohol use: Yes    Comment: occasionally  . Drug use: Yes    Types: Marijuana   He reports marijuana use but denies smoking history or IV drug use.  Family History: No family history on file.   Allergies and Medications: Allergies  Allergen Reactions  . Codeine Nausea And Vomiting   No current facility-administered medications on file prior to  encounter.    Current Outpatient Medications on File Prior to Encounter  Medication Sig Dispense Refill  . budesonide-formoterol (SYMBICORT) 80-4.5 MCG/ACT inhaler Inhale 2 puffs into the lungs 2 (two) times daily. (Patient not taking: Reported on 03/12/2019) 1 Inhaler 0  . DULoxetine (CYMBALTA) 60 MG capsule Take 1 capsule (60 mg total) by mouth daily. (Patient not taking: Reported on 03/12/2019) 30 capsule 0  . lisinopril (PRINIVIL,ZESTRIL) 20 MG tablet Take 1 tablet (20 mg total) by mouth daily. (Patient not taking: Reported on 03/12/2019) 30 tablet 0  . metFORMIN (GLUCOPHAGE) 500 MG tablet Take 1 tablet (500 mg total) by mouth 2 (two) times daily  with a meal. (Patient not taking: Reported on 03/12/2019) 60 tablet 0  . predniSONE (DELTASONE) 20 MG tablet Take 1 tablet (20 mg total) by mouth 2 (two) times daily with a meal. (Patient not taking: Reported on 03/12/2019) 10 tablet 0  . tamsulosin (FLOMAX) 0.4 MG CAPS capsule Take 1 capsule (0.4 mg total) by mouth daily. (Patient not taking: Reported on 03/12/2019) 30 capsule 0    Objective: BP (!) 114/47   Pulse 66   Temp 97.8 F (36.6 C) (Oral)   Resp 17   Ht 5\' 11"  (1.803 m)   SpO2 98%   BMI 22.04 kg/m   Physical Exam Constitutional:      General: He is not in acute distress.    Appearance: Normal appearance. He is normal weight. He is not ill-appearing.  HENT:     Nose: Nose normal. No congestion or rhinorrhea.  Eyes:     Conjunctiva/sclera: Conjunctivae normal.     Pupils: Pupils are equal, round, and reactive to light.  Neck:     Musculoskeletal: Normal range of motion and neck supple.  Cardiovascular:     Rate and Rhythm: Normal rate and regular rhythm.     Pulses: Normal pulses.     Heart sounds: Normal heart sounds. No murmur. No gallop.   Pulmonary:     Effort: Pulmonary effort is normal.     Breath sounds: Normal breath sounds. No wheezing or rales.  Abdominal:     General: Abdomen is flat.     Palpations: Abdomen is soft.     Tenderness: There is abdominal tenderness (diffusely to palpation).  Musculoskeletal: Normal range of motion.        General: No swelling.  Skin:    General: Skin is warm and dry.  Neurological:     General: No focal deficit present.     Mental Status: He is alert and oriented to person, place, and time.  Psychiatric:     Comments: Tangential thought process the redirectable.  Appeared to be splitting providers.  Noted many times that he was not crazy.  Suspicious and not trusting of medical providers.       Labs and Imaging: CBC BMET  Recent Labs  Lab 03/12/19 0917  WBC 3.8*  HGB 16.0  HCT 49.1  PLT 173   Recent Labs  Lab  03/12/19 0917  NA 140  K 3.9  CL 107  CO2 23  BUN 9  CREATININE 0.87  GLUCOSE 90  CALCIUM 9.2     Dg Chest 2 View  Result Date: 02/25/2019 CLINICAL DATA:  63 year old male with shortness of breath and chest pain. EXAM: CHEST - 2 VIEW COMPARISON:  11/18/2018 and 10/25/2018 chest radiographs. FINDINGS: Tortuous thoracic aorta with stable contour since November. Other mediastinal contours are within normal limits. Visualized tracheal air column is within  normal limits. Improved lung volumes. Both lungs appear clear. No pneumothorax or pleural effusion. Negative visible bowel gas pattern. No acute osseous abnormality identified. IMPRESSION: Chronically tortuous thoracic aorta. No acute cardiopulmonary abnormality. Electronically Signed   By: Genevie Ann M.D.   On: 02/25/2019 22:12   Dg Chest Portable 1 View  Result Date: 03/12/2019 CLINICAL DATA:  Cough and shortness of breath EXAM: PORTABLE CHEST 1 VIEW COMPARISON:  February 25, 2019 FINDINGS: There is no edema or consolidation. The heart size and pulmonary vascularity are normal. No adenopathy. No bone lesions. IMPRESSION: No edema or consolidation. Electronically Signed   By: Lowella Grip III M.D.   On: 03/12/2019 09:35     Matilde Haymaker, MD 03/12/2019, 3:44 PM PGY-1, [facility] pager: [phone], text pages welcome  Resident Attestation  I saw and evaluated the patient, performing the key elements of the service.I personally performed or re-performed the history, physical exam, and medical decision making activities of this service and have verified that the service and findings are accurately documented in the resident's note.I developed the management plan that is described in the resident's note, and I agree with the content, with my edits above.   Harolyn Rutherford, DO [facility], PGY-2

## 2019-03-13 LAB — CBC WITH DIFFERENTIAL/PLATELET
Abs Immature Granulocytes: 0.01 10*3/uL (ref 0.00–0.07)
Basophils Absolute: 0 10*3/uL (ref 0.0–0.1)
Basophils Relative: 0 %
Eosinophils Absolute: 0 10*3/uL (ref 0.0–0.5)
Eosinophils Relative: 1 %
HCT: 47.9 % (ref 39.0–52.0)
Hemoglobin: 15.7 g/dL (ref 13.0–17.0)
Immature Granulocytes: 0 %
Lymphocytes Relative: 30 %
Lymphs Abs: 1.8 10*3/uL (ref 0.7–4.0)
MCH: 28.3 pg (ref 26.0–34.0)
MCHC: 32.8 g/dL (ref 30.0–36.0)
MCV: 86.5 fL (ref 80.0–100.0)
Monocytes Absolute: 0.6 10*3/uL (ref 0.1–1.0)
Monocytes Relative: 9 %
Neutro Abs: 3.7 10*3/uL (ref 1.7–7.7)
Neutrophils Relative %: 60 %
Platelets: 195 10*3/uL (ref 150–400)
RBC: 5.54 MIL/uL (ref 4.22–5.81)
RDW: 13.5 % (ref 11.5–15.5)
WBC: 6.1 10*3/uL (ref 4.0–10.5)
nRBC: 0 % (ref 0.0–0.2)

## 2019-03-13 LAB — COMPREHENSIVE METABOLIC PANEL
ALT: 62 U/L — ABNORMAL HIGH (ref 0–44)
AST: 57 U/L — AB (ref 15–41)
Albumin: 4 g/dL (ref 3.5–5.0)
Alkaline Phosphatase: 75 U/L (ref 38–126)
Anion gap: 9 (ref 5–15)
BUN: 12 mg/dL (ref 8–23)
CO2: 24 mmol/L (ref 22–32)
Calcium: 9.7 mg/dL (ref 8.9–10.3)
Chloride: 107 mmol/L (ref 98–111)
Creatinine, Ser: 0.93 mg/dL (ref 0.61–1.24)
GFR calc Af Amer: >60 mL/min (ref >60)
GFR calc non Af Amer: >60 mL/min (ref >60)
GLUCOSE: 120 mg/dL — AB (ref 70–99)
Potassium: 4.1 mmol/L (ref 3.5–5.1)
Sodium: 140 mmol/L (ref 135–145)
TOTAL PROTEIN: 7.7 g/dL (ref 6.5–8.1)
Total Bilirubin: 0.8 mg/dL (ref 0.3–1.2)

## 2019-03-13 LAB — TROPONIN I: Troponin I: <0.03 ng/mL (ref <0.03)

## 2019-03-13 LAB — LIPID PANEL
Cholesterol: 238 mg/dL — ABNORMAL HIGH (ref 0–200)
HDL: 46 mg/dL (ref >40)
LDL Cholesterol: 169 mg/dL — ABNORMAL HIGH (ref 0–99)
Total CHOL/HDL Ratio: 5.2 RATIO
Triglycerides: 117 mg/dL (ref <150)
VLDL: 23 mg/dL (ref 0–40)

## 2019-03-13 LAB — HIV ANTIBODY (ROUTINE TESTING W REFLEX): HIV Screen 4th Generation wRfx: NONREACTIVE

## 2019-03-13 MED ORDER — ALBUTEROL SULFATE HFA 108 (90 BASE) MCG/ACT IN AERS: 2.0000 | INHALATION_SPRAY | Freq: Four times a day (QID) | RESPIRATORY_TRACT | 0 refills | Status: DC | PRN

## 2019-03-13 MED ORDER — ATORVASTATIN CALCIUM 20 MG PO TABS: 20.0000 mg | ORAL_TABLET | Freq: Every day | ORAL | 0 refills | Status: DC

## 2019-03-13 MED ORDER — DULOXETINE HCL 60 MG PO CPEP: 60.0000 mg | ORAL_CAPSULE | Freq: Every day | ORAL | 0 refills | Status: DC

## 2019-03-13 MED ORDER — ATORVASTATIN CALCIUM 10 MG PO TABS: 20.0000 mg | ORAL_TABLET | Freq: Every day | ORAL | Status: DC

## 2019-03-13 MED ORDER — AEROCHAMBER PLUS FLO-VU MISC: 0 refills | Status: DC

## 2019-03-13 MED ORDER — UMECLIDINIUM BROMIDE 62.5 MCG/INH IN AEPB: 1.0000 | INHALATION_SPRAY | Freq: Every day | RESPIRATORY_TRACT | 0 refills | Status: DC

## 2019-03-13 MED ORDER — LISINOPRIL 20 MG PO TABS: 20.0000 mg | ORAL_TABLET | Freq: Every day | ORAL | 0 refills | Status: DC

## 2019-03-13 MED FILL — ATORVASTATIN CALCIUM 20 MG: 20 | 30 days supply | Qty: 30 | Fill #0

## 2019-03-13 MED FILL — INCRUSE ELLIPTA 62.5 MCG IN: 62.5 | 30 days supply | Qty: 30 | Fill #0

## 2019-03-13 MED FILL — DULoxetine HCL 60 MG CPEP: 60 | 30 days supply | Qty: 30 | Fill #0

## 2019-03-13 MED FILL — PROVENTIL HFA 108 (90 BASE): 108 (90 BAS | 25 days supply | Qty: 7 | Fill #0

## 2019-03-13 MED FILL — LISINOPRIL 20 MG TABLET: 20 | 30 days supply | Qty: 30 | Fill #0

## 2019-03-13 NOTE — Discharge Summary (Signed)
[facility] Discharge Summary  Patient name: Devin Myers Medical record number: [id number] Date of birth: [birth year] Age: 63 y.o. Gender: male Date of Admission: 03/12/2019  Date of Discharge: 3/27 Admitting Physician: Martyn Malay, MD  Primary Care Provider: System, Pcp Not In Consultants: None  Indication for Hospitalization: ACS rule out  Discharge Diagnoses/Problem List:  Dyspnea Hypertension Hyperlipidemia Elevated liver enzymes Diabetes Anxiety COPD  Disposition: Discharge home  Discharge Condition: Stable  Discharge Exam:  General: Alert and cooperative and appears to be in no acute distress HEENT: Neck non-tender without lymphadenopathy, masses or thyromegaly Cardio: Normal S1 and S2, no S3 or S4. Rhythm is regular. No murmurs or rubs.   Pulm: Clear to auscultation bilaterally, no crackles, wheezing, or diminished breath sounds. Normal respiratory effort  Abdomen: Bowel sounds normal. Abdomen soft and non-tender.  Extremities: No peripheral edema. Warm/ well perfused.  Strong radial pulses. Neuro: Cranial nerves grossly intact  Brief Hospital Course:   ACS ruled out Devin Myers presented to the ED with dyspnea and chest pain exacerbated primarily by anxiety and sometimes exertion.  His initial EKG was unremarkable and he was admitted to the hospital for observation and ACS rule out.  Telemetry overnight showed normal sinus rhythm.  Troponins were trended and were flat below 0.03.  Morning EKG showed normal sinus rhythm.  Echocardiogram was discontinued by cardiology due to low suspicion for cardiac etiology and tight stewardship of resources in the setting of coronavirus outbreak.  Ultimately, it was thought that his chest pain was more likely related to poorly controlled anxiety.  He was encouraged to follow-up outpatient with [facility].  On the morning of 3/27, he is found to be medically stable and discharged home.  Elevated liver enzymes On  admission, he is found to have elevated liver enzymes with AST 66 and ALT 67.  Repeat CMP in the morning showed AST 57 and ALT 62.  No clear source of liver injury.  He was not exhibiting any concerning symptoms of hepatitis.  He was discharged and recommended follow-up with PCP for further assessment of AST, ALT elevations if they persist.   Issues for Follow Up:  1. Check liver enzymes, consider hepatitis C screening. 2. Ensure follow-up with [facility].  Significant Procedures: None  Significant Labs and Imaging:  Recent Labs  Lab 03/12/19 0917 03/13/19 0519  WBC 3.8* 6.1  HGB 16.0 15.7  HCT 49.1 47.9  PLT 173 195   Recent Labs  Lab 03/12/19 0917 03/13/19 0519  NA 140 140  K 3.9 4.1  CL 107 107  CO2 23 24  GLUCOSE 90 120*  BUN 9 12  CREATININE 0.87 0.93  CALCIUM 9.2 9.7  ALKPHOS 65 75  AST 66* 57*  ALT 67* 62*  ALBUMIN 3.9 4.0    Dg Chest 2 View  Result Date: 02/25/2019 CLINICAL DATA:  63 year old male with shortness of breath and chest pain. EXAM: CHEST - 2 VIEW COMPARISON:  11/18/2018 and 10/25/2018 chest radiographs. FINDINGS: Tortuous thoracic aorta with stable contour since November. Other mediastinal contours are within normal limits. Visualized tracheal air column is within normal limits. Improved lung volumes. Both lungs appear clear. No pneumothorax or pleural effusion. Negative visible bowel gas pattern. No acute osseous abnormality identified. IMPRESSION: Chronically tortuous thoracic aorta. No acute cardiopulmonary abnormality. Electronically Signed   By: Genevie Ann M.D.   On: 02/25/2019 22:12   Dg Chest Portable 1 View  Result Date: 03/12/2019 CLINICAL DATA:  Cough  and shortness of breath EXAM: PORTABLE CHEST 1 VIEW COMPARISON:  February 25, 2019 FINDINGS: There is no edema or consolidation. The heart size and pulmonary vascularity are normal. No adenopathy. No bone lesions. IMPRESSION: No edema or consolidation. Electronically Signed   By:  Lowella Grip III M.D.   On: 03/12/2019 09:35     Results/Tests Pending at Time of Discharge: none  Discharge Medications:  Allergies as of 03/13/2019      Reactions   Codeine Nausea And Vomiting      Medication List    STOP taking these medications   budesonide-formoterol 80-4.5 MCG/ACT inhaler Commonly known as:  SYMBICORT   metFORMIN 500 MG tablet Commonly known as:  GLUCOPHAGE   predniSONE 20 MG tablet Commonly known as:  Deltasone     TAKE these medications   aerochamber plus with mask inhaler Use with inhaler   albuterol 108 (90 Base) MCG/ACT inhaler Commonly known as:  PROVENTIL HFA;VENTOLIN HFA Inhale 2 puffs into the lungs every 6 (six) hours as needed for wheezing or shortness of breath.   atorvastatin 20 MG tablet Commonly known as:  LIPITOR Take 1 tablet (20 mg total) by mouth daily at 6 PM.   DULoxetine 60 MG capsule Commonly known as:  CYMBALTA Take 1 capsule (60 mg total) by mouth daily.   lisinopril 20 MG tablet Commonly known as:  PRINIVIL,ZESTRIL Take 1 tablet (20 mg total) by mouth daily.   tamsulosin 0.4 MG Caps capsule Commonly known as:  FLOMAX Take 1 capsule (0.4 mg total) by mouth daily.   umeclidinium bromide 62.5 MCG/INH Aepb Commonly known as:  INCRUSE ELLIPTA Inhale 1 puff into the lungs daily.       Discharge Instructions: Please refer to Patient Instructions section of EMR for full details.  Patient was counseled important signs and symptoms that should prompt return to medical care, changes in medications, dietary instructions, activity restrictions, and follow up appointments.   Follow-Up Appointments: Follow-up Information    [place]. Go on 03/25/2019.   Why:  f/u appointment made for 9:30 am- please call if you need to re-schedule- and come 15 min prior to appointment and bring medication list.  Contact information: [place]  [phone]          Matilde Haymaker, MD 03/16/2019, 2:21 AM PGY-1, [place]

## 2019-03-13 NOTE — Progress Notes (Signed)
Family Medicine Teaching Service Daily Progress Note Intern Pager: [phone]  Patient name: Devin Myers Medical record number: [id number] Date of birth: 08/14/1956 Age: 63 y.o. Gender: male  Primary Care Provider: System, Pcp Not In Consultants: cardiology Code Status: Full  Pt Overview and Major Events to Date:  3/26-admit for ACS rule out  Assessment and Plan: Devin Myers is a 63 y.o. male presenting with chest pressure and shortness of breath.  His past medical history is significant for diabetes, hypertension, depression/anxiety.  Dyspnea concerning for cardiac etiology, ACS rule out No acute events overnight.  Normal heart rate and respiratory rate through most of the night with 1 measurement of tachypnea 5 AM.  Troponins are flat at less than 0.03 overnight.  Telemetry was unremarkable overnight.  Morning EKG shows sinus bradycardia with no ST changes, T wave inversions or Q waves, possible left atrial enlargement.  Will await echocardiogram.  Based on observation so far, appears to be at low risk for cardiac etiology of his chest pressure.  Based on history, it is likely that this chest pressure is related to anxiety.  He would likely benefit from regular outpatient visits to address anxiety.  Echocardiogram was canceled as it would not be likely to change medical management. -Nitroglycerin as needed -Zofran PRN  Hypertension - lisinopril 20 mg daily  Hyperlipidemia Total cholesterol 238, LDL 169, HDL 46.   The 10-year ASCVD risk score Mikey Bussing DC Brooke Bonito., et al., 2013) is: 33%   Values used to calculate the score:     Age: 65 years     Sex: Male     Is Non-Hispanic African American: Yes     Diabetic: Yes     Tobacco smoker: Yes     Systolic Blood Pressure: 283 mmHg     Is BP treated: No     HDL Cholesterol: 46 mg/dL     Total Cholesterol: 238 mg/dL -Start rosuvastatin 20 g daily  Elevated liver enzymes Admission labs reveal AST 66, ALT 67.    AST and ALT remain elevated  this morning at AST 57, ALT 62.  We will continue to monitor. -Follow-up CMP outpatient  Diabetes Blood glucose 120 on CMP this morning.  No intervention at this time. -Monitor BG with BMP  Anxiety/depression - duloxetine 60 mg daily  COPD -Albuterol inhaler as needed -Restart Dulera -Follow-up CBC with differential in a.m. to assess if inhaled steroids are necessary  FEN/GI: Heart healthy diet Prophylaxis: Lovenox  Disposition: Possible discharge home today, 3/27.  Subjective:  No acute events overnight.  He reported that he slept poorly and is been in much of the night.  He continues to note chest pressure unchanged from admission.  No new complaints this morning.  Objective: Temp:  [97.7 F (36.5 C)-98 F (36.7 C)] 98 F (36.7 C) (03/26 1925) Pulse Rate:  [64-85] 85 (03/26 1925) Resp:  [16-24] 16 (03/27 0500) BP: (114-161)/(47-112) 142/112 (03/26 1925) SpO2:  [96 %-100 %] 96 % (03/26 1925) Weight:  [68.1 kg] 68.1 kg (03/27 0500)  General: Alert and cooperative and appears to be in no acute distress HEENT: Neck non-tender without lymphadenopathy, masses or thyromegaly Cardio: Normal S1 and S2, no S3 or S4. Rhythm is regular. No murmurs or rubs.   Pulm: Clear to auscultation bilaterally, no crackles, wheezing, or diminished breath sounds. Normal respiratory effort  Abdomen: Bowel sounds normal. Abdomen soft and non-tender.  Extremities: No peripheral edema. Warm/ well perfused.  Strong radial pulses. Neuro: Cranial nerves grossly intact  Laboratory: Recent Labs  Lab 03/12/19 0917 03/13/19 0519  WBC 3.8* 6.1  HGB 16.0 15.7  HCT 49.1 47.9  PLT 173 195   Recent Labs  Lab 03/12/19 0917 03/13/19 0519  NA 140 140  K 3.9 4.1  CL 107 107  CO2 23 24  BUN 9 12  CREATININE 0.87 0.93  CALCIUM 9.2 9.7  PROT 7.2 7.7  BILITOT 1.0 0.8  ALKPHOS 65 75  ALT 67* 62*  AST 66* 57*  GLUCOSE 90 120*    Lipid Panel     Component Value Date/Time   CHOL 238 (H)  03/13/2019 0519   TRIG 117 03/13/2019 0519   HDL 46 03/13/2019 0519   CHOLHDL 5.2 03/13/2019 0519   VLDL 23 03/13/2019 0519   LDLCALC 169 (H) 03/13/2019 0519    Imaging/Diagnostic Tests: Dg Chest Portable 1 View  Result Date: 03/12/2019 CLINICAL DATA:  Cough and shortness of breath EXAM: PORTABLE CHEST 1 VIEW COMPARISON:  February 25, 2019 FINDINGS: There is no edema or consolidation. The heart size and pulmonary vascularity are normal. No adenopathy. No bone lesions. IMPRESSION: No edema or consolidation. Electronically Signed   By: Lowella Grip III M.D.   On: 03/12/2019 09:35   Matilde Haymaker, MD 03/13/2019, 6:47 AM PGY-1, [place] Intern pager: [phone], text pages welcome

## 2019-03-13 NOTE — TOC Transition Note (Signed)
Transition of Care [facility]) - CM/SW Discharge Note Marvetta Gibbons RN, BSN Transitions of Care Unit 4E- RN Case Manager [phone]  Patient Details  Name: Devin Myers MRN: [id number] Date of Birth: 03-Jul-1956  Transition of Care [facility]) CM/SW Contact:  Dawayne Patricia, RN Phone Number: 03/13/2019, 2:11 PM   Clinical Narrative:   Pt presented with chest pain, has been cleared for transition back home. CM spoke with pt at bedside, independent with ambulation, states he works at [employer] and takes the bus. Has currently the bus is running free- no bus passes needed. Pt does report that he needs primary care MD- call made to the [facility]- and f/u appointment made for April 8 at 9:30- info given to pt and placed on AVS. - pt also states he has no money for medications- pt is eligible for MATCH and CM will override copay cost - explained to pt program and that this is a one time use in a 12 mo. Period- pt will need to f/u with [facility] for further medication refills and needs. Pt voices understanding. TOC to fill medications and deliver to bedside prior to discharge.     Final next level of care: Home/Self Care Barriers to Discharge: No Barriers Identified   Patient Goals and CMS Choice Patient states their goals for this hospitalization and ongoing recovery are:: "to get my medications and see a primary care doctor" CMS Medicare.gov Compare Post Acute Care list provided to:: Patient Choice offered to / list presented to : NA  Discharge Placement  Home/self care                     Discharge Plan and Services   Discharge Planning Services: CM Consult, [place] Program, Follow-up appt scheduled, Medication Assistance                HH Arranged: NA [place] Agency: NA   Social Determinants of Health (SDOH) Interventions     Readmission Risk Interventions No flowsheet data found.

## 2019-03-13 NOTE — Progress Notes (Signed)
On call provider d/c cardiac monitoring.

## 2019-03-13 NOTE — Discharge Instructions (Signed)
You were admitted for chest pain and it was not found to be originating from your heart. You were started on a new inhaler for your COPD and a medication for your cholesterol. It is very important that you take your medication as prescribed. You should follow up with your new primary doctor, Dr. Margarita Rana on 4/8 at [place].  Please also make time to be seen at [facility] in [place].  This center can provide ongoing care for anxiety and related diagnoses.

## 2019-03-13 NOTE — Progress Notes (Signed)
   Cancelling echocardiogram to minimize potential exposure risk secondary to covid-19.  Troponin is normal x 3, ECG no significant ischemic changes.   At 6pm yesterday, Dr. Owens Shark noted no CP at that time.   Candee Furbish, MD

## 2019-03-13 NOTE — Progress Notes (Signed)
IV removed. Discharge instructions reviewed with patient at this time. All questions answered.

## 2019-03-13 NOTE — Evaluation (Signed)
Physical Therapy Evaluation Patient Details Name: Stephenson Cichy MRN: [id number] DOB: 1956/01/24 Today's Date: 03/13/2019   History of Present Illness  Pt is a 63 y.o. M presenting with chest pressure and shortness of breath. PMH significant for diabetes, hypertension, depression/anxiety.  Clinical Impression  Pt admitted with above. On PT evaluation, pt stating he has constant chest pressure which is worsened with mobility. Pt very distracted and tangential about group home living situation and requesting to speak to [facility] about his treatment in the ED (RN notified). Pt ambulating hallway distances independently without difficulty. HR stable in 80's, SpO2 98%. Pt with no dyspnea on exertion. All education completed and pt has no further questions. PT signing off. Thank you for this consult.    Follow Up Recommendations No PT follow up    Equipment Recommendations  None recommended by PT    Recommendations for Other Services       Precautions / Restrictions Precautions Precautions: None Restrictions Weight Bearing Restrictions: No      Mobility  Bed Mobility               General bed mobility comments: Walking around in room upon entrance  Transfers Overall transfer level: Independent Equipment used: None                Ambulation/Gait Ambulation/Gait assistance: Independent Gait Distance (Feet): 350 Feet Assistive device: None Gait Pattern/deviations: Step-through pattern;Antalgic   Gait velocity interpretation: >2.62 ft/sec, indicative of community ambulatory General Gait Details: Slightly antalgic gait pattern with limp walking outside in hall which was not present when walking in room ?  Stairs            Wheelchair Mobility    Modified Rankin (Stroke Patients Only)       Balance Overall balance assessment: Independent                                           Pertinent Vitals/Pain Pain Assessment: Faces Faces  Pain Scale: Hurts a little bit Pain Location: chest  Pain Descriptors / Indicators: Pressure Pain Intervention(s): Monitored during session    Home Living Family/patient expects to be discharged to:: Group home Living Arrangements: Group Home                    Prior Function Level of Independence: Independent         Comments: Works at [employer]        Extremity/Trunk Assessment   Upper Extremity Assessment Upper Extremity Assessment: Overall WFL for tasks assessed    Lower Extremity Assessment Lower Extremity Assessment: Overall WFL for tasks assessed    Cervical / Trunk Assessment Cervical / Trunk Assessment: Normal  Communication   Communication: No difficulties  Cognition Arousal/Alertness: Awake/alert Behavior During Therapy: WFL for tasks assessed/performed Overall Cognitive Status: Within Functional Limits for tasks assessed                                        General Comments      Exercises     Assessment/Plan    PT Assessment Patent does not need any further PT services  PT Problem List         PT Treatment Interventions      PT  Goals (Current goals can be found in the Care Plan section)  Acute Rehab PT Goals Patient Stated Goal: "not leave the hospital yet." PT Goal Formulation: All assessment and education complete, DC therapy    Frequency     Barriers to discharge        Co-evaluation               AM-PAC PT "6 Clicks" Mobility  Outcome Measure Help needed turning from your back to your side while in a flat bed without using bedrails?: None Help needed moving from lying on your back to sitting on the side of a flat bed without using bedrails?: None Help needed moving to and from a bed to a chair (including a wheelchair)?: None Help needed standing up from a chair using your arms (e.g., wheelchair or bedside chair)?: None Help needed to walk in hospital room?: None Help needed  climbing 3-5 steps with a railing? : None 6 Click Score: 24    End of Session   Activity Tolerance: Patient tolerated treatment well Patient left: in chair;with call bell/phone within reach Nurse Communication: Mobility status PT Visit Diagnosis: Difficulty in walking, not elsewhere classified (R26.2)    Time: 1130-1145 PT Time Calculation (min) (ACUTE ONLY): 15 min   Charges:   PT Evaluation $PT Eval Low Complexity: 1 Low        Ellamae Sia, PT, DPT Acute Rehabilitation Services Pager [phone] Office [phone]   Willy Eddy 03/13/2019, 11:58 AM

## 2019-03-25 ENCOUNTER — Inpatient Hospital Stay: Payer: Self-pay | Admitting: Family Medicine

## 2019-05-15 ENCOUNTER — Emergency Department (HOSPITAL_COMMUNITY): Payer: Self-pay

## 2019-05-15 ENCOUNTER — Other Ambulatory Visit: Payer: Self-pay

## 2019-05-15 ENCOUNTER — Emergency Department (HOSPITAL_COMMUNITY)
Admission: EM | Admit: 2019-05-15 | Discharge: 2019-05-15 | Disposition: A | Discharge status: Home / Self Care | Payer: Self-pay | Attending: Emergency Medicine | Admitting: Emergency Medicine

## 2019-05-15 DIAGNOSIS — R0600 Dyspnea, unspecified: Secondary | ICD-10-CM | POA: Insufficient documentation

## 2019-05-15 DIAGNOSIS — J449 Chronic obstructive pulmonary disease, unspecified: Secondary | ICD-10-CM | POA: Insufficient documentation

## 2019-05-15 DIAGNOSIS — I10 Essential (primary) hypertension: Secondary | ICD-10-CM | POA: Insufficient documentation

## 2019-05-15 DIAGNOSIS — F419 Anxiety disorder, unspecified: Secondary | ICD-10-CM | POA: Insufficient documentation

## 2019-05-15 DIAGNOSIS — R519 Headache, unspecified: Secondary | ICD-10-CM

## 2019-05-15 DIAGNOSIS — R05 Cough: Secondary | ICD-10-CM | POA: Insufficient documentation

## 2019-05-15 DIAGNOSIS — H5789 Other specified disorders of eye and adnexa: Secondary | ICD-10-CM | POA: Insufficient documentation

## 2019-05-15 DIAGNOSIS — Z20828 Contact with and (suspected) exposure to other viral communicable diseases: Secondary | ICD-10-CM | POA: Insufficient documentation

## 2019-05-15 DIAGNOSIS — R296 Repeated falls: Secondary | ICD-10-CM | POA: Insufficient documentation

## 2019-05-15 DIAGNOSIS — M791 Myalgia, unspecified site: Secondary | ICD-10-CM | POA: Insufficient documentation

## 2019-05-15 DIAGNOSIS — R531 Weakness: Secondary | ICD-10-CM | POA: Insufficient documentation

## 2019-05-15 DIAGNOSIS — F17210 Nicotine dependence, cigarettes, uncomplicated: Secondary | ICD-10-CM | POA: Insufficient documentation

## 2019-05-15 DIAGNOSIS — Z79899 Other long term (current) drug therapy: Secondary | ICD-10-CM | POA: Insufficient documentation

## 2019-05-15 DIAGNOSIS — R51 Headache: Secondary | ICD-10-CM | POA: Insufficient documentation

## 2019-05-15 LAB — CK: Total CK: 266 U/L (ref 49–397)

## 2019-05-15 LAB — CBC WITH DIFFERENTIAL/PLATELET
Abs Immature Granulocytes: 0.01 10*3/uL (ref 0.00–0.07)
Basophils Absolute: 0.1 10*3/uL (ref 0.0–0.1)
Basophils Relative: 1 %
Eosinophils Absolute: 0.1 10*3/uL (ref 0.0–0.5)
Eosinophils Relative: 3 %
HCT: 50 % (ref 39.0–52.0)
Hemoglobin: 16.3 g/dL (ref 13.0–17.0)
Immature Granulocytes: 0 %
Lymphocytes Relative: 40 %
Lymphs Abs: 1.7 10*3/uL (ref 0.7–4.0)
MCH: 28.9 pg (ref 26.0–34.0)
MCHC: 32.6 g/dL (ref 30.0–36.0)
MCV: 88.7 fL (ref 80.0–100.0)
Monocytes Absolute: 0.4 10*3/uL (ref 0.1–1.0)
Monocytes Relative: 9 %
Neutro Abs: 2 10*3/uL (ref 1.7–7.7)
Neutrophils Relative %: 47 %
Platelets: 193 10*3/uL (ref 150–400)
RBC: 5.64 MIL/uL (ref 4.22–5.81)
RDW: 14.4 % (ref 11.5–15.5)
WBC: 4.2 10*3/uL (ref 4.0–10.5)
nRBC: 0 % (ref 0.0–0.2)

## 2019-05-15 LAB — RAPID URINE DRUG SCREEN, HOSP PERFORMED
Amphetamines: NOT DETECTED
Barbiturates: NOT DETECTED
Benzodiazepines: NOT DETECTED
Cocaine: NOT DETECTED
Opiates: NOT DETECTED
Tetrahydrocannabinol: POSITIVE — AB

## 2019-05-15 LAB — URINALYSIS, ROUTINE W REFLEX MICROSCOPIC
Bilirubin Urine: NEGATIVE
Glucose, UA: NEGATIVE mg/dL
Hgb urine dipstick: NEGATIVE
Ketones, ur: NEGATIVE mg/dL
Leukocytes,Ua: NEGATIVE
Nitrite: NEGATIVE
Protein, ur: NEGATIVE mg/dL
Specific Gravity, Urine: 1.017 (ref 1.005–1.030)
pH: 7 (ref 5.0–8.0)

## 2019-05-15 LAB — COMPREHENSIVE METABOLIC PANEL
ALT: 57 U/L — ABNORMAL HIGH (ref 0–44)
AST: 52 U/L — ABNORMAL HIGH (ref 15–41)
Albumin: 4.1 g/dL (ref 3.5–5.0)
Alkaline Phosphatase: 72 U/L (ref 38–126)
Anion gap: 11 (ref 5–15)
BUN: 10 mg/dL (ref 8–23)
CO2: 23 mmol/L (ref 22–32)
Calcium: 9.3 mg/dL (ref 8.9–10.3)
Chloride: 106 mmol/L (ref 98–111)
Creatinine, Ser: 0.87 mg/dL (ref 0.61–1.24)
GFR calc Af Amer: >60 mL/min (ref >60)
GFR calc non Af Amer: >60 mL/min (ref >60)
Glucose, Bld: 108 mg/dL — ABNORMAL HIGH (ref 70–99)
Potassium: 4 mmol/L (ref 3.5–5.1)
Sodium: 140 mmol/L (ref 135–145)
Total Bilirubin: 1 mg/dL (ref 0.3–1.2)
Total Protein: 8 g/dL (ref 6.5–8.1)

## 2019-05-15 LAB — CBG MONITORING, ED: Glucose-Capillary: 103 mg/dL — ABNORMAL HIGH (ref 70–99)

## 2019-05-15 LAB — TROPONIN I: Troponin I: <0.03 ng/mL (ref <0.03)

## 2019-05-15 LAB — ETHANOL: Alcohol, Ethyl (B): <10 mg/dL (ref <10)

## 2019-05-15 LAB — SARS CORONAVIRUS 2 BY RT PCR (HOSPITAL ORDER, PERFORMED IN ~~LOC~~ HOSPITAL LAB): SARS Coronavirus 2: NEGATIVE

## 2019-05-15 MED ORDER — DULOXETINE HCL 60 MG PO CPEP: 60.0000 mg | ORAL_CAPSULE | Freq: Every day | ORAL | 0 refills | Status: DC

## 2019-05-15 MED ORDER — ATORVASTATIN CALCIUM 20 MG PO TABS: 20.0000 mg | ORAL_TABLET | Freq: Every day | ORAL | 0 refills | Status: DC

## 2019-05-15 MED ORDER — TAMSULOSIN HCL 0.4 MG PO CAPS: 0.4000 mg | ORAL_CAPSULE | Freq: Every day | ORAL | 0 refills | Status: AC

## 2019-05-15 MED ORDER — UMECLIDINIUM BROMIDE 62.5 MCG/INH IN AEPB: 1.0000 | INHALATION_SPRAY | Freq: Every day | RESPIRATORY_TRACT | 0 refills | Status: DC

## 2019-05-15 MED ORDER — AEROCHAMBER PLUS FLO-VU MISC: 0 refills | Status: DC

## 2019-05-15 MED ORDER — LISINOPRIL 20 MG PO TABS: 20.0000 mg | ORAL_TABLET | Freq: Every day | ORAL | 0 refills | Status: DC

## 2019-05-15 MED ORDER — ALBUTEROL SULFATE HFA 108 (90 BASE) MCG/ACT IN AERS: 2.0000 | INHALATION_SPRAY | Freq: Four times a day (QID) | RESPIRATORY_TRACT | 0 refills | Status: DC | PRN

## 2019-05-15 MED ORDER — ACETAMINOPHEN 325 MG PO TABS
650.0000 mg | ORAL_TABLET | Freq: Once | ORAL | Status: AC
  Administered 2019-05-15: 16:00:00 650 mg via ORAL
  Filled 2019-05-15: qty 2

## 2019-05-15 MED ORDER — BENZONATATE 100 MG PO CAPS: 100.0000 mg | ORAL_CAPSULE | Freq: Three times a day (TID) | ORAL | 0 refills | Status: DC

## 2019-05-15 NOTE — Discharge Instructions (Signed)
Your testing today has been normal, no signs of stroke, no signs of infections, no signs of heart disease.  You may take the medication called Tessalon as needed for your cough, seek medical exam for severe or worsening symptoms, see the above phone number for a local clinic who you can call for a follow-up within the next week.

## 2019-05-15 NOTE — ED Triage Notes (Signed)
Pt c/o cough/sob/ body aches x 2 weeks and blurred vision that began last night a;ong with dizziness
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
## 2019-05-15 NOTE — ED Notes (Signed)
Pt left before E-signature pad was offered, discharge discussed with Dr Sabra Heck

## 2019-05-15 NOTE — ED Notes (Signed)
Per doctor order patient able to eat and drink. Given [place] sandwich meal and diet coke.

## 2019-05-15 NOTE — ED Notes (Signed)
Nurse navigator spoke with patient who states he has his own cell phone and updated family himself.
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
## 2019-07-13 ENCOUNTER — Other Ambulatory Visit: Payer: Self-pay

## 2019-07-13 ENCOUNTER — Encounter (HOSPITAL_COMMUNITY): Payer: Self-pay

## 2019-07-13 ENCOUNTER — Emergency Department (HOSPITAL_COMMUNITY)
Admission: EM | Admit: 2019-07-13 | Discharge: 2019-07-14 | Disposition: A | Discharge status: Home / Self Care | Payer: Self-pay | Attending: Emergency Medicine | Admitting: Emergency Medicine

## 2019-07-13 DIAGNOSIS — I10 Essential (primary) hypertension: Secondary | ICD-10-CM | POA: Insufficient documentation

## 2019-07-13 DIAGNOSIS — R451 Restlessness and agitation: Secondary | ICD-10-CM | POA: Insufficient documentation

## 2019-07-13 DIAGNOSIS — M79672 Pain in left foot: Secondary | ICD-10-CM | POA: Insufficient documentation

## 2019-07-13 DIAGNOSIS — F17210 Nicotine dependence, cigarettes, uncomplicated: Secondary | ICD-10-CM | POA: Insufficient documentation

## 2019-07-13 DIAGNOSIS — Z79899 Other long term (current) drug therapy: Secondary | ICD-10-CM | POA: Insufficient documentation

## 2019-07-13 DIAGNOSIS — J449 Chronic obstructive pulmonary disease, unspecified: Secondary | ICD-10-CM | POA: Insufficient documentation

## 2019-07-13 DIAGNOSIS — M79671 Pain in right foot: Secondary | ICD-10-CM | POA: Insufficient documentation

## 2019-07-13 LAB — BASIC METABOLIC PANEL
Anion gap: 9 (ref 5–15)
BUN: 10 mg/dL (ref 8–23)
CO2: 25 mmol/L (ref 22–32)
Calcium: 9.3 mg/dL (ref 8.9–10.3)
Chloride: 107 mmol/L (ref 98–111)
Creatinine, Ser: 0.87 mg/dL (ref 0.61–1.24)
GFR calc Af Amer: >60 mL/min (ref >60)
GFR calc non Af Amer: >60 mL/min (ref >60)
Glucose, Bld: 112 mg/dL — ABNORMAL HIGH (ref 70–99)
Potassium: 3.6 mmol/L (ref 3.5–5.1)
Sodium: 141 mmol/L (ref 135–145)

## 2019-07-13 LAB — CBC
HCT: 44.1 % (ref 39.0–52.0)
Hemoglobin: 15.1 g/dL (ref 13.0–17.0)
MCH: 30 pg (ref 26.0–34.0)
MCHC: 34.2 g/dL (ref 30.0–36.0)
MCV: 87.5 fL (ref 80.0–100.0)
Platelets: 211 10*3/uL (ref 150–400)
RBC: 5.04 MIL/uL (ref 4.22–5.81)
RDW: 13.8 % (ref 11.5–15.5)
WBC: 4.8 10*3/uL (ref 4.0–10.5)
nRBC: 0 % (ref 0.0–0.2)

## 2019-07-13 NOTE — ED Triage Notes (Signed)
Pt states he has bilateral foot pain onging for months. He works at [employer] but has not wanted to miss any work. He has a corn to the bottom of his right foot. He states he is diabetic but does not take any medications. No PCP. He is new to the area.

## 2019-07-14 NOTE — ED Notes (Signed)
Pt discharged from ED in wheelchair. Pt verbalized that he was very unhappy with services provided. Number for patient experience provided. Pt demanded to leave before discharge instructions printed. Pt also refused discharge vital signs.

## 2019-07-14 NOTE — ED Notes (Signed)
Pt made it to nurse first requesting paperwork, doctors note, and crutches. This RN took pt requested paperwork and order for crutches placed and carried out.

## 2019-07-14 NOTE — Discharge Instructions (Addendum)
The painful bumps on your feet need to be evaluated by a Podiatrist.  Please contact the number listed and schedule an appointment.

## 2019-07-14 NOTE — ED Provider Notes (Signed)
[facility] EMERGENCY DEPARTMENT Provider Note   CSN: [id number] Arrival date & time: 07/13/19  2106     History   Chief Complaint Chief Complaint  Patient presents with  . Foot Pain    HPI Devin Myers is a 63 y.o. male.     Patient presents to the ED with a chief complaint of painful bumps on his feet.  He states that they have been present for quite some time.  He states that he got a shot at another facility that completely removed his pain.  He has not been seen by a podiatrist for this complaint.  States that the pain makes it hard for him to walk.  The history is provided by the patient. No language interpreter was used.    Past Medical History:  Diagnosis Date  . COPD (chronic obstructive pulmonary disease) ([place])   . Depression   . Hypertension     Patient Active Problem List   Diagnosis Date Noted  . Chest pain 03/12/2019  . Shortness of breath   . Depression   . Anxiety   . 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 No pertinent surgical history.      Home Medications    Prior to Admission medications   Medication Sig Start Date End Date Taking? Authorizing Provider  albuterol (VENTOLIN HFA) 108 (90 Base) MCG/ACT inhaler Inhale 2 puffs into the lungs every 6 (six) hours as needed for wheezing or shortness of breath. 05/15/19   Noemi Chapel, MD  atorvastatin (LIPITOR) 20 MG tablet Take 1 tablet (20 mg total) by mouth daily at 6 PM. 05/15/19   Noemi Chapel, MD  benzonatate (TESSALON) 100 MG capsule Take 1 capsule (100 mg total) by mouth every 8 (eight) hours. 05/15/19   Noemi Chapel, MD  DULoxetine (CYMBALTA) 60 MG capsule Take 1 capsule (60 mg total) by mouth daily. 05/15/19   Noemi Chapel, MD  lisinopril (ZESTRIL) 20 MG tablet Take 1 tablet (20 mg total) by mouth daily. 05/15/19   Noemi Chapel, MD  Spacer/Aero-Holding Chambers (AEROCHAMBER PLUS WITH MASK) inhaler Use with inhaler 05/15/19   Noemi Chapel, MD  tamsulosin (FLOMAX) 0.4 MG CAPS capsule Take 1 capsule (0.4 mg total) by mouth daily. 05/15/19   Noemi Chapel, MD  umeclidinium bromide (INCRUSE ELLIPTA) 62.5 MCG/INH AEPB Inhale 1 puff into the lungs daily. 05/15/19   Noemi Chapel, MD    Family History History reviewed. No pertinent family history.  Social History Social History   Tobacco Use  . Smoking status: Current Some Day Smoker    Types: Cigarettes  . Smokeless tobacco: Never Used  Substance Use Topics  . Alcohol use: Yes    Comment: occasionally  . Drug use: Yes    Types: Marijuana     Allergies   Codeine   Review of Systems Review of Systems  Constitutional: Positive for fever.  Musculoskeletal: Positive for gait problem.  Skin: Negative for rash and wound.     Physical Exam Updated Vital Signs BP (!) 153/122 (BP Location: Right Arm)   Pulse (!) 59   Temp 98 F (36.7 C) (Oral)   Resp 20   Ht 5\' 11"  (1.803 m)   Wt 69.9 kg   SpO2 100%   BMI 21.48 kg/m   Physical Exam Vitals signs and nursing note reviewed.  Constitutional:      Appearance: He is well-developed.  HENT:     Head: Normocephalic and atraumatic.  Eyes:     Conjunctiva/sclera: Conjunctivae normal.  Neck:     Musculoskeletal: Neck supple.  Cardiovascular:     Rate and Rhythm: Normal rate.     Heart sounds: No murmur.  Pulmonary:     Effort: Pulmonary effort is normal. No respiratory distress.  Abdominal:     General: There is no distension.  Musculoskeletal: Normal range of motion.  Skin:    General: Skin is dry.     Comments: Corns vs plantars warts on bilateral soles  Neurological:     Mental Status: He is alert and oriented to person, place, and time.  Psychiatric:     Comments: angry      ED Treatments / Results  Labs (all labs ordered are listed, but only abnormal results are displayed) Labs Reviewed  BASIC METABOLIC PANEL - Abnormal; Notable for the following components:      Result Value   Glucose,  Bld 112 (*)    All other components within normal limits  CBC    EKG None  Radiology No results found.  Procedures Procedures (including critical care time)  Medications Ordered in ED Medications - No data to display   Initial Impression / Assessment and Plan / ED Course  I have reviewed the triage vital signs and the nursing notes.  Pertinent labs & imaging results that were available during my care of the patient were reviewed by me and considered in my medical decision making (see chart for details).        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  Wants them treated here now.  Offered padded dressings, crutches, and podiatry referral.  Patient became agitated and cursed his way out of the exam room ambulating under his own power.  Patient demanding to speak to an Scientist, physiological.  Charge RN talked with patient and gave patient experience number.  Patient left without his papers or crutches.  Final Clinical Impressions(s) / ED Diagnoses   Final diagnoses:  Foot pain, right  Foot pain, left    ED Discharge Orders    None       Montine Circle, PA-C 07/14/19 [id number]    Mesner, Corene Cornea, MD 07/14/19 [phone]

## 2019-08-10 NOTE — Progress Notes (Signed)
Patient ID: Devin Myers, male   DOB: September 18, 1956, 63 y.o.   MRN: [id number]  Virtual Visit via Telephone Note  I connected with Devin Myers on 08/12/19 at  1:50 PM EDT by telephone and verified that I am speaking with the correct person using two identifiers.   I discussed the limitations, risks, security and privacy concerns of performing an evaluation and management service by telephone and the availability of in person appointments. I also discussed with the patient that there may be a patient responsible charge related to this service. The patient expressed understanding and agreed to proceed.  Patient location: parking lot My Location:  [facility] office Persons on the call: me and the patient   History of Present Illness: After ED visit for foot problems on 07/13/2019. He has untreated htn.  He expresses being angry bc this is a phone visit.  Needs all meds RF.  Did not see a podiatrist yet for his feet.  Missed work today.  Has felt depressed out of cymbalta. Denies SI/HI.    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  Wants them treated here now.  Offered padded dressings, crutches, and podiatry referral.  Patient became agitated and cursed his way out of the exam room ambulating under his own power.  Patient demanding to speak to an Scientist, physiological.  Charge RN talked with patient and gave patient experience number.  Patient left without his papers or crutches.    Observations/Objective:  A&Ox3.  Redirected often   Assessment and Plan: 1. Corn - Ambulatory referral to Podiatry  2. Depression, unspecified depression type -RF cymbalta - Ambulatory referral to Social Work  3. Hypertension, unspecified type Resume lisinopril  4. Encounter for examination following treatment at hospital Will get PCP assigned  Follow Up Instructions: 1 month-assign PCP   I discussed the assessment and treatment plan with the patient. The patient was provided an  opportunity to ask questions and all were answered. The patient agreed with the plan and demonstrated an understanding of the instructions.   The patient was advised to call back or seek an in-person evaluation if the symptoms worsen or if the condition fails to improve as anticipated.  I provided 14 minutes of non-face-to-face time during this encounter.   Freeman Caldron, PA-C

## 2019-08-12 ENCOUNTER — Other Ambulatory Visit: Payer: Self-pay

## 2019-08-12 ENCOUNTER — Ambulatory Visit: Payer: Self-pay | Attending: Family Medicine | Admitting: Physician Assistant

## 2019-08-12 DIAGNOSIS — F329 Major depressive disorder, single episode, unspecified: Secondary | ICD-10-CM

## 2019-08-12 DIAGNOSIS — L84 Corns and callosities: Secondary | ICD-10-CM

## 2019-08-12 DIAGNOSIS — Z09 Encounter for follow-up examination after completed treatment for conditions other than malignant neoplasm: Secondary | ICD-10-CM

## 2019-08-12 DIAGNOSIS — I10 Essential (primary) hypertension: Secondary | ICD-10-CM

## 2019-08-12 DIAGNOSIS — F32A Depression, unspecified: Secondary | ICD-10-CM

## 2019-08-12 MED ORDER — AEROCHAMBER PLUS FLO-VU MISC: 0 refills | Status: DC

## 2019-08-12 MED ORDER — LISINOPRIL 20 MG PO TABS: 20.0000 mg | ORAL_TABLET | Freq: Every day | ORAL | 1 refills | Status: DC

## 2019-08-12 MED ORDER — ATORVASTATIN CALCIUM 20 MG PO TABS: 20.0000 mg | ORAL_TABLET | Freq: Every day | ORAL | 3 refills | Status: AC

## 2019-08-12 MED ORDER — ALBUTEROL SULFATE HFA 108 (90 BASE) MCG/ACT IN AERS: 2.0000 | INHALATION_SPRAY | Freq: Four times a day (QID) | RESPIRATORY_TRACT | 1 refills | Status: DC | PRN

## 2019-08-12 MED ORDER — DULOXETINE HCL 60 MG PO CPEP: 60.0000 mg | ORAL_CAPSULE | Freq: Every day | ORAL | 1 refills | Status: AC

## 2019-08-12 MED ORDER — UMECLIDINIUM BROMIDE 62.5 MCG/INH IN AEPB: 1.0000 | INHALATION_SPRAY | Freq: Every day | RESPIRATORY_TRACT | 0 refills | Status: AC

## 2019-08-12 NOTE — Progress Notes (Signed)
Patient verified DOB Patient has not taken medication today Patient has not eaten today. Patient complains of SOB.

## 2019-08-19 ENCOUNTER — Telehealth: Payer: Self-pay | Admitting: Licensed Clinical Social Worker

## 2019-08-19 NOTE — Telephone Encounter (Signed)
Call placed to patient to follow up on [facility] referral. LCSW introduced self and explained role at [facility]. Pt was informed of consult from PCP to address mental health and/or resources.   Pt shared that he recently had a stressful visit at [facility] which resulted in law enforcement being called. He has not yet picked up his medications for the depression. LCSW encouraged pt to schedule an appointment to further assess mental health and to identify supportive resources. Pt agreed to call back at a later time to schedule appointment. No additional concerns noted.

## 2019-08-28 ENCOUNTER — Telehealth: Payer: Self-pay | Admitting: *Deleted

## 2019-08-28 NOTE — Telephone Encounter (Signed)
-----   Message from Trecia Rogers, [place] sent at 08/12/2019  1:57 PM EDT ----- Regarding: PCP establish care Patient needs 1 month establish care

## 2019-08-28 NOTE — Telephone Encounter (Signed)
Attempted to make appt, do not have access to edit template

## 2019-09-09 ENCOUNTER — Other Ambulatory Visit: Payer: Self-pay

## 2019-09-09 ENCOUNTER — Emergency Department (HOSPITAL_COMMUNITY): Payer: Self-pay

## 2019-09-09 ENCOUNTER — Emergency Department (HOSPITAL_COMMUNITY)
Admission: EM | Admit: 2019-09-09 | Discharge: 2019-09-09 | Disposition: A | Discharge status: Home / Self Care | Payer: Self-pay | Attending: Emergency Medicine | Admitting: Emergency Medicine

## 2019-09-09 DIAGNOSIS — Z79899 Other long term (current) drug therapy: Secondary | ICD-10-CM | POA: Insufficient documentation

## 2019-09-09 DIAGNOSIS — R160 Hepatomegaly, not elsewhere classified: Secondary | ICD-10-CM | POA: Insufficient documentation

## 2019-09-09 DIAGNOSIS — F17210 Nicotine dependence, cigarettes, uncomplicated: Secondary | ICD-10-CM | POA: Insufficient documentation

## 2019-09-09 DIAGNOSIS — I10 Essential (primary) hypertension: Secondary | ICD-10-CM | POA: Insufficient documentation

## 2019-09-09 DIAGNOSIS — J449 Chronic obstructive pulmonary disease, unspecified: Secondary | ICD-10-CM | POA: Insufficient documentation

## 2019-09-09 LAB — LIPASE, BLOOD: Lipase: 83 U/L — ABNORMAL HIGH (ref 11–51)

## 2019-09-09 LAB — COMPREHENSIVE METABOLIC PANEL
ALT: 63 U/L — ABNORMAL HIGH (ref 0–44)
AST: 55 U/L — ABNORMAL HIGH (ref 15–41)
Albumin: 4.1 g/dL (ref 3.5–5.0)
Alkaline Phosphatase: 78 U/L (ref 38–126)
Anion gap: 8 (ref 5–15)
BUN: 12 mg/dL (ref 8–23)
CO2: 28 mmol/L (ref 22–32)
Calcium: 9.1 mg/dL (ref 8.9–10.3)
Chloride: 105 mmol/L (ref 98–111)
Creatinine, Ser: 0.86 mg/dL (ref 0.61–1.24)
GFR calc Af Amer: >60 mL/min (ref >60)
GFR calc non Af Amer: >60 mL/min (ref >60)
Glucose, Bld: 88 mg/dL (ref 70–99)
Potassium: 4.1 mmol/L (ref 3.5–5.1)
Sodium: 141 mmol/L (ref 135–145)
Total Bilirubin: 0.7 mg/dL (ref 0.3–1.2)
Total Protein: 7.9 g/dL (ref 6.5–8.1)

## 2019-09-09 LAB — CBC
HCT: 46.3 % (ref 39.0–52.0)
Hemoglobin: 15.4 g/dL (ref 13.0–17.0)
MCH: 29.6 pg (ref 26.0–34.0)
MCHC: 33.3 g/dL (ref 30.0–36.0)
MCV: 88.9 fL (ref 80.0–100.0)
Platelets: 203 10*3/uL (ref 150–400)
RBC: 5.21 MIL/uL (ref 4.22–5.81)
RDW: 13.7 % (ref 11.5–15.5)
WBC: 4.5 10*3/uL (ref 4.0–10.5)
nRBC: 0 % (ref 0.0–0.2)

## 2019-09-09 LAB — URINALYSIS, ROUTINE W REFLEX MICROSCOPIC
Bilirubin Urine: NEGATIVE
Glucose, UA: NEGATIVE mg/dL
Hgb urine dipstick: NEGATIVE
Ketones, ur: NEGATIVE mg/dL
Leukocytes,Ua: NEGATIVE
Nitrite: NEGATIVE
Protein, ur: NEGATIVE mg/dL
Specific Gravity, Urine: 1.013 (ref 1.005–1.030)
pH: 8 (ref 5.0–8.0)

## 2019-09-09 MED ORDER — HYDROCODONE-ACETAMINOPHEN 5-325 MG PO TABS: 1.0000 | ORAL_TABLET | Freq: Four times a day (QID) | ORAL | 0 refills | Status: DC | PRN

## 2019-09-09 MED ORDER — IOHEXOL 300 MG/ML  SOLN
100.0000 mL | Freq: Once | INTRAMUSCULAR | Status: AC | PRN
  Administered 2019-09-09: 15:00:00 100 mL via INTRAVENOUS

## 2019-09-09 MED ORDER — SODIUM CHLORIDE (PF) 0.9 % IJ SOLN
INTRAMUSCULAR | Status: AC
  Filled 2019-09-09: qty 50

## 2019-09-09 NOTE — ED Provider Notes (Signed)
[facility] DEPT Provider Note   CSN: [id number] Arrival date & time: 09/09/19  1053     History   Chief Complaint Chief Complaint  Patient presents with   Abdominal Pain    HPI Devin Myers is a 63 y.o. male.     The history is provided by the patient.  Abdominal Pain Pain location:  RLQ and RUQ Pain quality: aching and cramping   Pain radiates to:  Does not radiate Pain severity:  Mild Onset quality:  Gradual Duration:  2 days Timing:  Intermittent Progression:  Waxing and waning Chronicity:  New Context: not alcohol use and not eating   Relieved by:  Nothing Worsened by:  Nothing Associated symptoms: no anorexia, no chest pain, no chills, no constipation, no cough, no diarrhea, no dysuria, no fever, no hematuria, no nausea, no shortness of breath, no sore throat and no vomiting   Risk factors: has not had multiple surgeries     Past Medical History:  Diagnosis Date   COPD (chronic obstructive pulmonary disease) ([place])    Depression    Hypertension     Patient Active Problem List   Diagnosis Date Noted   Chest pain 03/12/2019   Shortness of breath    Depression    Anxiety    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      Home Medications    Prior to Admission medications   Medication Sig Start Date End Date Taking? Authorizing Provider  atorvastatin (LIPITOR) 20 MG tablet Take 1 tablet (20 mg total) by mouth daily at 6 PM. 08/12/19  Yes McClung, Angela M, PA-C  lisinopril (ZESTRIL) 20 MG tablet Take 1 tablet (20 mg total) by mouth daily. 08/12/19  Yes Argentina Donovan, PA-C  tamsulosin (FLOMAX) 0.4 MG CAPS capsule Take 1 capsule (0.4 mg total) by mouth daily. 05/15/19  Yes Noemi Chapel, MD  albuterol (VENTOLIN HFA) 108 (90 Base) MCG/ACT inhaler Inhale 2 puffs into the lungs every 6 (six) hours as needed for wheezing or shortness of breath. Patient not taking:  Reported on 09/09/2019 08/12/19   Argentina Donovan, PA-C  DULoxetine (CYMBALTA) 60 MG capsule Take 1 capsule (60 mg total) by mouth daily. Patient not taking: Reported on 09/09/2019 08/12/19   Argentina Donovan, PA-C  HYDROcodone-acetaminophen (NORCO/VICODIN) 5-325 MG tablet Take 1 tablet by mouth every 6 (six) hours as needed for up to 15 doses. 09/09/19   Virginio Isidore, DO  Spacer/Aero-Holding Chambers (AEROCHAMBER PLUS WITH MASK) inhaler Use with inhaler 08/12/19   Freeman Caldron M, PA-C  umeclidinium bromide (INCRUSE ELLIPTA) 62.5 MCG/INH AEPB Inhale 1 puff into the lungs daily. Patient not taking: Reported on 09/09/2019 08/12/19   Argentina Donovan, PA-C    Family History No family history on file.  Social History Social History   Tobacco Use   Smoking status: Current Some Day Smoker    Types: Cigarettes   Smokeless tobacco: Never Used  Substance Use Topics   Alcohol use: Yes    Comment: occasionally   Drug use: Yes    Types: Marijuana     Allergies   Codeine   Review of Systems Review of Systems  Constitutional: Negative for chills and fever.  HENT: Negative for ear pain and sore throat.   Eyes: Negative for pain and visual disturbance.  Respiratory: Negative for cough and shortness of breath.   Cardiovascular: Negative for chest pain and palpitations.  Gastrointestinal:  Positive for abdominal pain. Negative for abdominal distention, anorexia, blood in stool, constipation, diarrhea, nausea and vomiting.  Genitourinary: Negative for dysuria and hematuria.  Musculoskeletal: Negative for arthralgias and back pain.  Skin: Negative for color change and rash.  Neurological: Negative for seizures and syncope.  All other systems reviewed and are negative.    Physical Exam Updated Vital Signs  ED Triage Vitals [09/09/19 1112]  Enc Vitals Group     BP (!) 167/109     Pulse Rate 80     Resp 19     Temp 98.4 F (36.9 C)     Temp src      SpO2 97 %     Weight       Height      Head Circumference      Peak Flow      Pain Score      Pain Loc      Pain Edu?      Excl. in [place]?     Physical Exam Vitals signs and nursing note reviewed.  Constitutional:      General: He is not in acute distress.    Appearance: He is well-developed. He is not ill-appearing.  HENT:     Head: Normocephalic and atraumatic.     Mouth/Throat:     Mouth: Mucous membranes are moist.  Eyes:     Extraocular Movements: Extraocular movements intact.     Conjunctiva/sclera: Conjunctivae normal.     Pupils: Pupils are equal, round, and reactive to light.  Neck:     Musculoskeletal: Neck supple.  Cardiovascular:     Rate and Rhythm: Normal rate and regular rhythm.     Heart sounds: Normal heart sounds. No murmur.  Pulmonary:     Effort: Pulmonary effort is normal. No respiratory distress.     Breath sounds: Normal breath sounds.  Abdominal:     General: Abdomen is flat. There is no distension.     Palpations: Abdomen is soft.     Tenderness: There is abdominal tenderness in the right lower quadrant. There is no right CVA tenderness, left CVA tenderness, guarding or rebound.  Skin:    General: Skin is warm and dry.     Capillary Refill: Capillary refill takes less than 2 seconds.  Neurological:     General: No focal deficit present.     Mental Status: He is alert.  Psychiatric:        Mood and Affect: Mood normal.      ED Treatments / Results  Labs (all labs ordered are listed, but only abnormal results are displayed) Labs Reviewed  LIPASE, BLOOD - Abnormal; Notable for the following components:      Result Value   Lipase 83 (*)    All other components within normal limits  COMPREHENSIVE METABOLIC PANEL - Abnormal; Notable for the following components:   AST 55 (*)    ALT 63 (*)    All other components within normal limits  URINALYSIS, ROUTINE W REFLEX MICROSCOPIC - Abnormal; Notable for the following components:   APPearance HAZY (*)    All other components  within normal limits  CBC    EKG None  Radiology Ct Abdomen Pelvis W Contrast  Result Date: 09/09/2019 CLINICAL DATA:  Abdominal Distension LLQ pain with nausea x 2 days 100 ml omni 300^161mL OMNIPAQUE IOHEXOL 300 MG/ML SOLN EXAM: CT ABDOMEN AND PELVIS WITH CONTRAST TECHNIQUE: Multidetector CT imaging of the abdomen and pelvis was performed using the standard  protocol following bolus administration of intravenous contrast. CONTRAST:  131mL OMNIPAQUE IOHEXOL 300 MG/ML  SOLN COMPARISON:  None. FINDINGS: Lower chest: No acute abnormality. Hepatobiliary: The liver is mildly shrunken with nodular contour compatible with cirrhosis. In the superior left hepatic lobe there are multiple confluent rim enhancing masses overall spanning 7.3 x 2.8 cm (series 5, image 31). There are a few prominent periportal lymph nodes measuring up to 1.0 cm in diameter which are indeterminate. No gallstones, gallbladder wall thickening, or biliary dilatation. Pancreas: Unremarkable. No pancreatic ductal dilatation or surrounding inflammatory changes. Spleen: Normal in size without focal abnormality. Adrenals/Urinary Tract: Adrenal glands are unremarkable. Kidneys are normal, without renal calculi, focal lesion, or hydronephrosis. Bladder is unremarkable. Stomach/Bowel: Stomach is within normal limits. Appendix appears normal. No evidence of bowel wall thickening, distention, or inflammatory changes. There are a few scattered colonic diverticula without evidence of diverticulitis. Vascular/Lymphatic: Minimal scattered atherosclerotic calcification in the abdominal aorta. No evidence of aneurysm. Reproductive: Prostate is unremarkable. Other: No abdominal wall hernia or abnormality. No abdominopelvic ascites. Musculoskeletal: No acute or significant osseous findings. IMPRESSION: 1. Hepatic cirrhosis with multiple confluent rim enhancing masses within the superior left hepatic lobe. Findings are concerning for hepatocellular carcinoma  or possibly cholangiocarcinoma. Recommend GI referral. 2. Mildly prominent periportal lymph nodes measuring up to 1.0 cm in diameter which are indeterminate. 3. No CT finding to explain the patient's lower abdominal pain. Aortic Atherosclerosis (ICD10-I70.0). These results were called by telephone at the time of interpretation on 09/09/2019 at 3:11 pm to provider Providence Stivers , who verbally acknowledged these results. Electronically Signed   By: Audie Pinto M.D.   On: 09/09/2019 15:12    Procedures Procedures (including critical care time)  Medications Ordered in ED Medications  sodium chloride (PF) 0.9 % injection (has no administration in time range)  iohexol (OMNIPAQUE) 300 MG/ML solution 100 mL (100 mLs Intravenous Contrast Given 09/09/19 1431)     Initial Impression / Assessment and Plan / ED Course  I have reviewed the triage vital signs and the nursing notes.  Pertinent labs & imaging results that were available during my care of the patient were reviewed by me and considered in my medical decision making (see chart for details).     Devin Myers is a 63 year old male with history of hypertension, COPD presents the ED with right-sided abdominal pain.  Patient with symptoms on and off for last 2 days.  Normal vitals.  No fever.  Patient denies any nausea, vomiting.  Denies kidney stones.  Denies any urinary symptoms.  Denies any history of abdominal surgeries.  Patient mostly tender in the right side of his abdomen.  No specific Murphy sign.  Most of the pain appears to be in the lower quadrant.  However does have some tenderness in the right upper quadrant, epigastric area.  Will get lab work including CT scan abdomen pelvis.  Concern for appendicitis versus bowel obstruction versus less likely hepatobiliary process.  Patient with mild elevation of lipase at 83.  However bilirubin normal.  Mild elevation of liver enzymes but seen previously.  No significant leukocytosis, anemia,  electrolyte abnormality.  Awaiting CT scan of abdomen and pelvis to rule out any intra-abdominal process.  Radiology called me on the phone to discuss findings.  Patient likely has liver cancer or gallbladder cancer.  However there is nothing else acute including appendicitis or bowel obstruction.  Lab work was overall unremarkable as discussed previously.  Contacted [facility] Ellouise Newer on the phone  and they will follow-up closely outpatient with the patient to come up with a primary diagnosis.  Patient was made aware of these findings.  He was given the phone number to follow-up with GI.  No active narcotic pain prescription and will send him some narcotic pain medicine for breakthrough pain.  Recommend Motrin for pain.  Discharged from ED in good condition.  Given return precautions.  This chart was dictated using voice recognition software.  Despite best efforts to proofread,  errors can occur which can change the documentation meaning.    Final Clinical Impressions(s) / ED Diagnoses   Final diagnoses:  Liver mass    ED Discharge Orders         Ordered    HYDROcodone-acetaminophen (NORCO/VICODIN) 5-325 MG tablet  Every 6 hours PRN,   Status:  Discontinued     09/09/19 1535    HYDROcodone-acetaminophen (NORCO/VICODIN) 5-325 MG tablet  Every 6 hours PRN     09/09/19 [place], [place], DO 09/09/19 1544

## 2019-09-09 NOTE — Discharge Instructions (Addendum)
Follow-up with gastroenterology as discussed. We are concerned you may have a cancer or liver or gallbladder.

## 2019-09-09 NOTE — ED Triage Notes (Signed)
Pt reports that he was clocking into work yesterday and had sharp pains on right side.

## 2019-09-11 ENCOUNTER — Encounter: Payer: Self-pay | Admitting: Physician Assistant

## 2019-09-17 ENCOUNTER — Ambulatory Visit: Payer: Non-veteran care | Admitting: Physician Assistant

## 2019-11-07 ENCOUNTER — Other Ambulatory Visit: Payer: Self-pay

## 2019-11-07 ENCOUNTER — Emergency Department (HOSPITAL_COMMUNITY)
Admission: EM | Admit: 2019-11-07 | Discharge: 2019-11-07 | Disposition: A | Discharge status: Home / Self Care | Payer: Self-pay | Attending: Emergency Medicine | Admitting: Emergency Medicine

## 2019-11-07 DIAGNOSIS — J449 Chronic obstructive pulmonary disease, unspecified: Secondary | ICD-10-CM | POA: Insufficient documentation

## 2019-11-07 DIAGNOSIS — I10 Essential (primary) hypertension: Secondary | ICD-10-CM | POA: Insufficient documentation

## 2019-11-07 DIAGNOSIS — Z79899 Other long term (current) drug therapy: Secondary | ICD-10-CM | POA: Insufficient documentation

## 2019-11-07 DIAGNOSIS — M79671 Pain in right foot: Secondary | ICD-10-CM | POA: Insufficient documentation

## 2019-11-07 DIAGNOSIS — M79672 Pain in left foot: Secondary | ICD-10-CM | POA: Insufficient documentation

## 2019-11-07 DIAGNOSIS — Z720 Tobacco use: Secondary | ICD-10-CM | POA: Insufficient documentation

## 2019-11-07 LAB — CBC
HCT: 44.5 % (ref 39.0–52.0)
Hemoglobin: 14.7 g/dL (ref 13.0–17.0)
MCH: 29.4 pg (ref 26.0–34.0)
MCHC: 33 g/dL (ref 30.0–36.0)
MCV: 89 fL (ref 80.0–100.0)
Platelets: 184 10*3/uL (ref 150–400)
RBC: 5 MIL/uL (ref 4.22–5.81)
RDW: 14 % (ref 11.5–15.5)
WBC: 4.3 10*3/uL (ref 4.0–10.5)
nRBC: 0 % (ref 0.0–0.2)

## 2019-11-07 LAB — COMPREHENSIVE METABOLIC PANEL
ALT: 66 U/L — ABNORMAL HIGH (ref 0–44)
AST: 58 U/L — ABNORMAL HIGH (ref 15–41)
Albumin: 3.6 g/dL (ref 3.5–5.0)
Alkaline Phosphatase: 61 U/L (ref 38–126)
Anion gap: 13 (ref 5–15)
BUN: 13 mg/dL (ref 8–23)
CO2: 18 mmol/L — ABNORMAL LOW (ref 22–32)
Calcium: 8.5 mg/dL — ABNORMAL LOW (ref 8.9–10.3)
Chloride: 109 mmol/L (ref 98–111)
Creatinine, Ser: 0.74 mg/dL (ref 0.61–1.24)
GFR calc Af Amer: >60 mL/min (ref >60)
GFR calc non Af Amer: >60 mL/min (ref >60)
Glucose, Bld: 87 mg/dL (ref 70–99)
Potassium: 3.6 mmol/L (ref 3.5–5.1)
Sodium: 140 mmol/L (ref 135–145)
Total Bilirubin: 0.8 mg/dL (ref 0.3–1.2)
Total Protein: 6.9 g/dL (ref 6.5–8.1)

## 2019-11-07 MED ORDER — KETOROLAC TROMETHAMINE 15 MG/ML IJ SOLN
15.0000 mg | Freq: Once | INTRAMUSCULAR | Status: AC
  Administered 2019-11-07: 13:00:00 15 mg via INTRAMUSCULAR
  Filled 2019-11-07: qty 1

## 2019-11-07 MED ORDER — NAPROXEN 500 MG PO TABS: 500.0000 mg | ORAL_TABLET | Freq: Two times a day (BID) | ORAL | 0 refills | Status: DC

## 2019-11-07 NOTE — ED Triage Notes (Signed)
Patient arrives POV with c/o severe bilateral foot pain. Patient has minimal non-pitting swelling and some redness. Patient stated that this has been ongoing x1 week.   Rates pain a 10/10.

## 2019-11-07 NOTE — ED Provider Notes (Signed)
[facility] EMERGENCY DEPARTMENT Provider Note   CSN: [id number] Arrival date & time: 11/07/19  1110     History   Chief Complaint Chief Complaint  Patient presents with  . Leg Swelling    HPI Devin Myers is a 63 y.o. male presenting for evaluation of bilateral foot pain.  Patient states for "a wild" he has been having pain in both feet.  Patient states that he stands on his feet all day for work, and by the end of the day his feet are sometimes swollen and very tender.  Patient states this also happens so in the morning or when he has not been on his feet for a long time.  Patient states it is a sharp shooting pain.  It is present in both feet, but not all the time.  No side is worse than the other.  He states he got a shot in his but a long time ago which cured him of the symptoms for several years, but he does not know what it was.  He denies fall, trauma, or injury.  He has not taken anything for this including Tylenol ibuprofen.  Patient states he thinks he has a history of diabetes, but does not take any medicine at this time, as he does not have insurance and no PCP.  Patient states he has been told before that he may have neuropathy. He denies recent fevers, chills, sore throat, cough, chest pain, shortness of breath, nausea, vomiting, domino pain, urinary symptoms, normal bowel movements.  Additional history obtained from chart review.  Patient recently found to have a liver mass, concerning for cancer of liver vs GB.  He has not followed up with GI.  Per chart review, it appears they attempted to contact him with a letter.  Initially, patient appears to be in contacted regarding primary care, but this has not been established.  Additional history of alcohol abuse, liver cirrhosis, COPD, hypertension.  No documented history of diabetes     HPI  Past Medical History:  Diagnosis Date  . COPD (chronic obstructive pulmonary disease) ([place])   . Depression   .  Hypertension     Patient Active Problem List   Diagnosis Date Noted  . Chest pain 03/12/2019  . Shortness of breath   . Depression   . Anxiety   . 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      Home Medications    Prior to Admission medications   Medication Sig Start Date End Date Taking? Authorizing Provider  albuterol (VENTOLIN HFA) 108 (90 Base) MCG/ACT inhaler Inhale 2 puffs into the lungs every 6 (six) hours as needed for wheezing or shortness of breath. Patient not taking: Reported on 09/09/2019 08/12/19   Argentina Donovan, PA-C  atorvastatin (LIPITOR) 20 MG tablet Take 1 tablet (20 mg total) by mouth daily at 6 PM. 08/12/19   Thereasa Solo, Dionne Bucy, PA-C  DULoxetine (CYMBALTA) 60 MG capsule Take 1 capsule (60 mg total) by mouth daily. Patient not taking: Reported on 09/09/2019 08/12/19   Argentina Donovan, PA-C  HYDROcodone-acetaminophen (NORCO/VICODIN) 5-325 MG tablet Take 1 tablet by mouth every 6 (six) hours as needed for up to 15 doses. 09/09/19   Curatolo, Adam, DO  lisinopril (ZESTRIL) 20 MG tablet Take 1 tablet (20 mg total) by mouth daily. 08/12/19   Argentina Donovan, PA-C  naproxen (NAPROSYN) 500 MG tablet Take 1 tablet (500 mg total)  by mouth 2 (two) times daily with a meal. 11/07/19   Azaryah Heathcock, PA-C  Spacer/Aero-Holding Chambers (AEROCHAMBER PLUS WITH MASK) inhaler Use with inhaler 08/12/19   Argentina Donovan, PA-C  tamsulosin (FLOMAX) 0.4 MG CAPS capsule Take 1 capsule (0.4 mg total) by mouth daily. 05/15/19   Noemi Chapel, MD  umeclidinium bromide (INCRUSE ELLIPTA) 62.5 MCG/INH AEPB Inhale 1 puff into the lungs daily. Patient not taking: Reported on 09/09/2019 08/12/19   Argentina Donovan, PA-C    Family History No family history on file.  Social History Social History   Tobacco Use  . Smoking status: Current Some Day Smoker    Types: Cigarettes  . Smokeless tobacco: Never Used  Substance Use Topics   . Alcohol use: Yes    Comment: occasionally  . Drug use: Yes    Types: Marijuana     Allergies   Codeine   Review of Systems Review of Systems  Musculoskeletal: Positive for arthralgias.  All other systems reviewed and are negative.    Physical Exam Updated Vital Signs BP (!) 127/94   Pulse 64   Temp 97.6 F (36.4 C) (Oral)   Resp 20   Ht 5\' 10"  (1.778 m)   Wt 69.9 kg   SpO2 100%   BMI 22.10 kg/m   Physical Exam Vitals signs and nursing note reviewed.  Constitutional:      General: He is not in acute distress.    Appearance: He is well-developed.     Comments: Appears nontoxic  HENT:     Head: Normocephalic and atraumatic.  Eyes:     Conjunctiva/sclera: Conjunctivae normal.     Pupils: Pupils are equal, round, and reactive to light.  Neck:     Musculoskeletal: Normal range of motion and neck supple.  Cardiovascular:     Rate and Rhythm: Normal rate and regular rhythm.     Pulses: Normal pulses.  Pulmonary:     Effort: Pulmonary effort is normal. No respiratory distress.     Breath sounds: Normal breath sounds. No wheezing.  Abdominal:     General: There is no distension.     Palpations: Abdomen is soft. There is no mass.     Tenderness: There is no abdominal tenderness. There is no guarding or rebound.  Musculoskeletal: Normal range of motion.     Right lower leg: No edema.     Left lower leg: No edema.     Comments: No lower leg swelling or edema.  Pedal pulses intact.  On the base of the right foot is a corn without signs of infection.  Full active range of motion of the toes and ankles without difficulty.   Skin:    General: Skin is warm and dry.     Capillary Refill: Capillary refill takes less than 2 seconds.  Neurological:     Mental Status: He is alert and oriented to person, place, and time.      ED Treatments / Results  Labs (all labs ordered are listed, but only abnormal results are displayed) Labs Reviewed  COMPREHENSIVE METABOLIC  PANEL - Abnormal; Notable for the following components:      Result Value   CO2 18 (*)    Calcium 8.5 (*)    AST 58 (*)    ALT 66 (*)    All other components within normal limits  CBC    EKG None  Radiology No results found.  Procedures Procedures (including critical care time)  Medications Ordered in  ED Medications  ketorolac (TORADOL) 15 MG/ML injection 15 mg (15 mg Intramuscular Given 11/07/19 1250)     Initial Impression / Assessment and Plan / ED Course  I have reviewed the triage vital signs and the nursing notes.  Pertinent labs & imaging results that were available during my care of the patient were reviewed by me and considered in my medical decision making (see chart for details).        Patient presenting for evaluation of bilateral foot pain.  Physical examination, he appears nontoxic.  No obvious swelling at this time.  No change in urinary symptoms, doubt this is due to something such as CHF or urinary obstruction.  Consider prolonged standing causing peripheral edema.  Additionally, patient with a corn on the bottom of his right foot, this could be causing pain.  Patient reporting history of diabetes, although this is not documented.  Consider neuropathy.  Discussed with patient.  Patient is adamant that he needs a shot to help with pain. Will give toradol.   On reassessment, patient reports pain is improved.  Labs reassuring, no abnormality the kidney function.  Liver function is stable.  Discussed with patient that he needs to follow-up with GI for further evaluation of his liver mass.  Will give patient a short course of naproxen for pain.  Encourage patient to establish care with a primary care doctor.  At this time, patient appears safe for discharge.  Return precautions given.  Patient states he understands and agrees to plan.  Final Clinical Impressions(s) / ED Diagnoses   Final diagnoses:  Bilateral foot pain    ED Discharge Orders         Ordered     naproxen (NAPROSYN) 500 MG tablet  2 times daily with meals     11/07/19 1345           Cayden Granholm, PA-C 11/07/19 1552    Tegeler, Gwenyth Allegra, MD 11/07/19 [phone]

## 2019-11-07 NOTE — Discharge Instructions (Signed)
Take naproxen 2 times a day with meals.  Do not take other anti-inflammatories at the same time (Advil, Motrin, ibuprofen, Aleve). You may supplement with Tylenol if you need further pain control. Follow-up with the stomach doctors listed below for further evaluation of your liver. Follow-up with the primary care office to status care. Return to the emergency room with any new, worsening, concerning symptoms.

## 2020-04-03 ENCOUNTER — Other Ambulatory Visit: Payer: Self-pay

## 2020-04-03 ENCOUNTER — Emergency Department (HOSPITAL_COMMUNITY)
Admission: EM | Admit: 2020-04-03 | Discharge: 2020-04-03 | Disposition: A | Discharge status: Home / Self Care | Payer: HRSA Program | Attending: Emergency Medicine | Admitting: Emergency Medicine

## 2020-04-03 DIAGNOSIS — I10 Essential (primary) hypertension: Secondary | ICD-10-CM | POA: Diagnosis not present

## 2020-04-03 DIAGNOSIS — J069 Acute upper respiratory infection, unspecified: Secondary | ICD-10-CM | POA: Insufficient documentation

## 2020-04-03 DIAGNOSIS — F17210 Nicotine dependence, cigarettes, uncomplicated: Secondary | ICD-10-CM | POA: Insufficient documentation

## 2020-04-03 DIAGNOSIS — J449 Chronic obstructive pulmonary disease, unspecified: Secondary | ICD-10-CM | POA: Diagnosis not present

## 2020-04-03 DIAGNOSIS — U071 COVID-19: Secondary | ICD-10-CM | POA: Diagnosis not present

## 2020-04-03 DIAGNOSIS — Z20822 Contact with and (suspected) exposure to covid-19: Secondary | ICD-10-CM

## 2020-04-03 DIAGNOSIS — Z79899 Other long term (current) drug therapy: Secondary | ICD-10-CM | POA: Diagnosis not present

## 2020-04-03 DIAGNOSIS — R05 Cough: Secondary | ICD-10-CM | POA: Diagnosis present

## 2020-04-03 MED ORDER — BUTALBITAL-APAP-CAFFEINE 50-325-40 MG PO TABS
2.0000 | ORAL_TABLET | Freq: Once | ORAL | Status: AC
  Administered 2020-04-03: 2 via ORAL
  Filled 2020-04-03: qty 2

## 2020-04-03 MED ORDER — ALBUTEROL SULFATE HFA 108 (90 BASE) MCG/ACT IN AERS
2.0000 | INHALATION_SPRAY | Freq: Once | RESPIRATORY_TRACT | Status: AC
  Administered 2020-04-03: 2 via RESPIRATORY_TRACT
  Filled 2020-04-03: qty 6.7

## 2020-04-03 MED ORDER — BENZONATATE 100 MG PO CAPS
100.0000 mg | ORAL_CAPSULE | Freq: Once | ORAL | Status: AC
  Administered 2020-04-03: 13:00:00 100 mg via ORAL
  Filled 2020-04-03: qty 1

## 2020-04-03 NOTE — Discharge Instructions (Signed)
Person Under Monitoring Name: Devin Myers  Location: [place] [address]   Infection Prevention Recommendations for Individuals Confirmed to have, or Being Evaluated for, 2019 Novel Coronavirus (COVID-19) Infection Who Receive Care at Home  Individuals who are confirmed to have, or are being evaluated for, COVID-19 should follow the prevention steps below until a healthcare provider or local or state health department says they can return to normal activities.  Stay home except to get medical care You should restrict activities outside your home, except for getting medical care. Do not go to work, school, or public areas, and do not use public transportation or taxis.  Call ahead before visiting your doctor Before your medical appointment, call the healthcare provider and tell them that you have, or are being evaluated for, COVID-19 infection. This will help the healthcare providers office take steps to keep other people from getting infected. Ask your healthcare provider to call the local or state health department.  Monitor your symptoms Seek prompt medical attention if your illness is worsening (e.g., difficulty breathing). Before going to your medical appointment, call the healthcare provider and tell them that you have, or are being evaluated for, COVID-19 infection. Ask your healthcare provider to call the local or state health department.  Wear a facemask You should wear a facemask that covers your nose and mouth when you are in the same room with other people and when you visit a healthcare provider. People who live with or visit you should also wear a facemask while they are in the same room with you.  Separate yourself from other people in your home As much as possible, you should stay in a different room from other people in your home. Also, you should use a separate bathroom, if available.  Avoid sharing household items You should not share  dishes, drinking glasses, cups, eating utensils, towels, bedding, or other items with other people in your home. After using these items, you should wash them thoroughly with soap and water.  Cover your coughs and sneezes Cover your mouth and nose with a tissue when you cough or sneeze, or you can cough or sneeze into your sleeve. Throw used tissues in a lined trash can, and immediately wash your hands with soap and water for at least 20 seconds or use an alcohol-based hand rub.  Wash your [facility] your hands often and thoroughly with soap and water for at least 20 seconds. You can use an alcohol-based hand sanitizer if soap and water are not available and if your hands are not visibly dirty. Avoid touching your eyes, nose, and mouth with unwashed hands.   Prevention Steps for Caregivers and Household Members of Individuals Confirmed to have, or Being Evaluated for, COVID-19 Infection Being Cared for in the Home  If you live with, or provide care at home for, a person confirmed to have, or being evaluated for, COVID-19 infection please follow these guidelines to prevent infection:  Follow healthcare providers instructions Make sure that you understand and can help the patient follow any healthcare provider instructions for all care.  Provide for the patients basic needs You should help the patient with basic needs in the home and provide support for getting groceries, prescriptions, and other personal needs.  Monitor the patients symptoms If they are getting sicker, call his or her medical provider and tell them that the patient has, or is being evaluated for, COVID-19 infection. This will help the healthcare providers office take  steps to keep other people from getting infected. Ask the healthcare provider to call the local or state health department.  Limit the number of people who have contact with the patient If possible, have only one caregiver for the patient. Other  household members should stay in another home or place of residence. If this is not possible, they should stay in another room, or be separated from the patient as much as possible. Use a separate bathroom, if available. Restrict visitors who do not have an essential need to be in the home.  Keep older adults, very young children, and other sick people away from the patient Keep older adults, very young children, and those who have compromised immune systems or chronic health conditions away from the patient. This includes people with chronic heart, lung, or kidney conditions, diabetes, and cancer.  Ensure good ventilation Make sure that shared spaces in the home have good air flow, such as from an air conditioner or an opened window, weather permitting.  Wash your hands often Wash your hands often and thoroughly with soap and water for at least 20 seconds. You can use an alcohol based hand sanitizer if soap and water are not available and if your hands are not visibly dirty. Avoid touching your eyes, nose, and mouth with unwashed hands. Use disposable paper towels to dry your hands. If not available, use dedicated cloth towels and replace them when they become wet.  Wear a facemask and gloves Wear a disposable facemask at all times in the room and gloves when you touch or have contact with the patients blood, body fluids, and/or secretions or excretions, such as sweat, saliva, sputum, nasal mucus, vomit, urine, or feces.  Ensure the mask fits over your nose and mouth tightly, and do not touch it during use. Throw out disposable facemasks and gloves after using them. Do not reuse. Wash your hands immediately after removing your facemask and gloves. If your personal clothing becomes contaminated, carefully remove clothing and launder. Wash your hands after handling contaminated clothing. Place all used disposable facemasks, gloves, and other waste in a lined container before disposing them with  other household waste. Remove gloves and wash your hands immediately after handling these items.  Do not share dishes, glasses, or other household items with the patient Avoid sharing household items. You should not share dishes, drinking glasses, cups, eating utensils, towels, bedding, or other items with a patient who is confirmed to have, or being evaluated for, COVID-19 infection. After the person uses these items, you should wash them thoroughly with soap and water.  Wash laundry thoroughly Immediately remove and wash clothes or bedding that have blood, body fluids, and/or secretions or excretions, such as sweat, saliva, sputum, nasal mucus, vomit, urine, or feces, on them. Wear gloves when handling laundry from the patient. Read and follow directions on labels of laundry or clothing items and detergent. In general, wash and dry with the warmest temperatures recommended on the label.  Clean all areas the individual has used often Clean all touchable surfaces, such as counters, tabletops, doorknobs, bathroom fixtures, toilets, phones, keyboards, tablets, and bedside tables, every day. Also, clean any surfaces that may have blood, body fluids, and/or secretions or excretions on them. Wear gloves when cleaning surfaces the patient has come in contact with. Use a diluted bleach solution (e.g., dilute bleach with 1 part bleach and 10 parts water) or a household disinfectant with a label that says EPA-registered for coronaviruses. To make a bleach solution  at home, add 1 tablespoon of bleach to 1 quart (4 cups) of water. For a larger supply, add  cup of bleach to 1 gallon (16 cups) of water. Read labels of cleaning products and follow recommendations provided on product labels. Labels contain instructions for safe and effective use of the cleaning product including precautions you should take when applying the product, such as wearing gloves or eye protection and making sure you have good ventilation  during use of the product. Remove gloves and wash hands immediately after cleaning.  Monitor yourself for signs and symptoms of illness Caregivers and household members are considered close contacts, should monitor their health, and will be asked to limit movement outside of the home to the extent possible. Follow the monitoring steps for close contacts listed on the symptom monitoring form.   ? If you have additional questions, contact your local health department or call the epidemiologist on call at [phone] (available 24/7). ? This guidance is subject to change. For the most up-to-date guidance from [facility], please refer to their website: [email or web address]

## 2020-04-03 NOTE — ED Triage Notes (Signed)
PT to ed with c/o of being around a positve COVID-19 patient. Pt started having a headache and cough last week. Pt was notified person was positive today and headache pain increased so decided to come and get checked out.

## 2020-04-04 ENCOUNTER — Telehealth (HOSPITAL_COMMUNITY): Payer: Self-pay

## 2020-04-04 LAB — SARS CORONAVIRUS 2 (TAT 6-24 HRS): SARS Coronavirus 2: POSITIVE — AB

## 2020-04-05 ENCOUNTER — Telehealth (HOSPITAL_COMMUNITY): Payer: Self-pay

## 2020-04-05 ENCOUNTER — Telehealth: Payer: Self-pay | Admitting: Unknown Physician Specialty

## 2020-04-05 NOTE — Telephone Encounter (Signed)
Called to discuss with Devin Myers about Covid symptoms and the use of bamlanivimab, a monoclonal antibody infusion for those with mild to moderate Covid symptoms and at a high risk of hospitalization.    Difficult to get an idea of timeline, but loss of taste and smell for about 2 weeks.  Pt does not qualify for infusion therapy as his symptoms first presented > 10 days prior to timing of infusion. Symptoms tier reviewed as well as criteria for ending isolation. Preventative practices reviewed. Patient verbalized understanding   Patient Active Problem List   Diagnosis Date Noted  . Chest pain 03/12/2019  . Shortness of breath   . Depression   . Anxiety   . 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

## 2020-04-05 NOTE — Telephone Encounter (Signed)
Spoke with pt. Today and discussed pt.s + Covid results.  While speaking to pt. He requested to speak to a Education officer, museum to help with a bug infestation in his home. I spoke with the Education officer, museum and was given resources for pt.  I called pt. Back and left a message on his voice mail  With the information of  the [employer] number [phone] and [employer] [place] [phone].

## 2020-04-06 ENCOUNTER — Encounter: Payer: Self-pay | Admitting: *Deleted

## 2020-04-06 NOTE — ED Provider Notes (Signed)
[name] DEPT Provider Note   CSN: [id number] Arrival date & time: 04/03/20  1126     History Chief Complaint  Patient presents with  . Headache  . Cough    Devin Myers is a 64 y.o. male.  HPI   64 year old male with occasional cough and headache.  He is concerned because he has been around someone that was recently positive for Covid.  He has a history of COPD but denies any acute respiratory symptoms.  Mild nausea.  Lost of taste and smell.  Appetite has been fair though.  No vomiting or diarrhea.  Past Medical History:  Diagnosis Date  . COPD (chronic obstructive pulmonary disease) ([place])   . Depression   . Hypertension     Patient Active Problem List   Diagnosis Date Noted  . Chest pain 03/12/2019  . Shortness of breath   . Depression   . Anxiety   . 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     No family history on file.  Social History   Tobacco Use  . Smoking status: Current Some Day Smoker    Types: Cigarettes  . Smokeless tobacco: Never Used  Substance Use Topics  . Alcohol use: Yes    Comment: occasionally  . Drug use: Yes    Types: Marijuana    Home Medications Prior to Admission medications   Medication Sig Start Date End Date Taking? Authorizing Provider  albuterol (VENTOLIN HFA) 108 (90 Base) MCG/ACT inhaler Inhale 2 puffs into the lungs every 6 (six) hours as needed for wheezing or shortness of breath. Patient not taking: Reported on 09/09/2019 08/12/19   Argentina Donovan, PA-C  atorvastatin (LIPITOR) 20 MG tablet Take 1 tablet (20 mg total) by mouth daily at 6 PM. 08/12/19   Thereasa Solo, Dionne Bucy, PA-C  DULoxetine (CYMBALTA) 60 MG capsule Take 1 capsule (60 mg total) by mouth daily. Patient not taking: Reported on 09/09/2019 08/12/19   Argentina Donovan, PA-C  HYDROcodone-acetaminophen (NORCO/VICODIN) 5-325 MG tablet Take 1 tablet by mouth every 6 (six) hours  as needed for up to 15 doses. 09/09/19   Curatolo, Adam, DO  lisinopril (ZESTRIL) 20 MG tablet Take 1 tablet (20 mg total) by mouth daily. 08/12/19   Argentina Donovan, PA-C  naproxen (NAPROSYN) 500 MG tablet Take 1 tablet (500 mg total) by mouth 2 (two) times daily with a meal. 11/07/19   Caccavale, Sophia, PA-C  Spacer/Aero-Holding Chambers (AEROCHAMBER PLUS WITH MASK) inhaler Use with inhaler 08/12/19   Argentina Donovan, PA-C  tamsulosin (FLOMAX) 0.4 MG CAPS capsule Take 1 capsule (0.4 mg total) by mouth daily. 05/15/19   Noemi Chapel, MD  umeclidinium bromide (INCRUSE ELLIPTA) 62.5 MCG/INH AEPB Inhale 1 puff into the lungs daily. Patient not taking: Reported on 09/09/2019 08/12/19   Argentina Donovan, PA-C    Allergies    Codeine  Review of Systems   Review of Systems All systems reviewed and negative, other than as noted in HPI.  Physical Exam Updated Vital Signs BP 134/83 (BP Location: Right Arm)   Pulse 88   Temp 97.6 F (36.4 C) (Oral)   Resp 16   Ht 5\' 11"  (1.803 m)   Wt 79.4 kg   SpO2 100%   BMI 24.41 kg/m   Physical Exam Vitals and nursing note reviewed.  Constitutional:      General: He is not in acute distress.    Appearance:  He is well-developed.  HENT:     Head: Normocephalic and atraumatic.  Eyes:     General:        Right eye: No discharge.        Left eye: No discharge.     Conjunctiva/sclera: Conjunctivae normal.  Cardiovascular:     Rate and Rhythm: Normal rate and regular rhythm.     Heart sounds: Normal heart sounds. No murmur. No friction rub. No gallop.   Pulmonary:     Effort: Pulmonary effort is normal. No respiratory distress.     Breath sounds: Normal breath sounds.  Abdominal:     General: There is no distension.     Palpations: Abdomen is soft.     Tenderness: There is no abdominal tenderness.  Musculoskeletal:        General: No tenderness.     Cervical back: Neck supple.  Skin:    General: Skin is warm and dry.  Neurological:      Mental Status: He is alert.  Psychiatric:        Behavior: Behavior normal.        Thought Content: Thought content normal.     ED Results / Procedures / Treatments   Labs (all labs ordered are listed, but only abnormal results are displayed) Labs Reviewed  SARS CORONAVIRUS 2 (TAT 6-24 HRS) - Abnormal; Notable for the following components:      Result Value   SARS Coronavirus 2 POSITIVE (*)    All other components within normal limits    EKG None  Radiology No results found.  Procedures Procedures (including critical care time)  Medications Ordered in ED Medications  albuterol (VENTOLIN HFA) 108 (90 Base) MCG/ACT inhaler 2 puff (2 puffs Inhalation Given 04/03/20 1301)  butalbital-acetaminophen-caffeine (FIORICET) 50-325-40 MG per tablet 2 tablet (2 tablets Oral Given 04/03/20 1301)  benzonatate (TESSALON) capsule 100 mg (100 mg Oral Given 04/03/20 1302)    ED Course  I have reviewed the triage vital signs and the nursing notes.  Pertinent labs & imaging results that were available during my care of the patient were reviewed by me and considered in my medical decision making (see chart for details).    MDM Rules/Calculators/A&P                      64 year old male with some fatigue, cough and a mild headache.  Neuro exam is nonfocal.  Afebrile.  Generally well-appearing.  May potentially be Covid given known exposure.  Will send testing.  Low suspicion for emergent process.  Symptomatic treatment of his cough and headache.  Patient follow-up otherwise.  Devin Myers was evaluated in Emergency Department on 04/06/2020 for the symptoms described in the history of present illness. He was evaluated in the context of the global COVID-19 pandemic, which necessitated consideration that the patient might be at risk for infection with the SARS-CoV-2 virus that causes COVID-19. Institutional protocols and algorithms that pertain to the evaluation of patients at risk for COVID-19 are in  a state of rapid change based on information released by regulatory bodies including the CDC and federal and state organizations. These policies and algorithms were followed during the patient's care in the ED.  Final Clinical Impression(s) / ED Diagnoses Final diagnoses:  Upper respiratory tract infection, unspecified type  Close exposure to COVID-19 virus    Rx / DC Orders ED Discharge Orders    None       Virgel Manifold, MD 04/06/20 1652

## 2020-04-06 NOTE — [facility] Nurse Program (Signed)
  Dept: [facility] Nurse Program Note  Date of Encounter: 04/06/2020  Past Medical History: Past Medical History:  Diagnosis Date  . COPD (chronic obstructive pulmonary disease) ([place])   . Depression   . Hypertension     Encounter Details: CNP Questionnaire - 04/06/20 1000      Questionnaire   Patient Status  Not Applicable    Race  Black or African American    Location Patient Served At  [facility]  Not Applicable    Uninsured  Uninsured (NEW 1x/quarter)    Food  No food insecurities    Housing/Utilities  Yes, have permanent housing   per SW staying with family   Transportation  No transportation needs    Interpersonal Safety  Yes, feel physically and emotionally safe where you currently live    Medication  No medication insecurities    Medical Provider  No    Referrals  [facility];Other   home quarantine   ED Visit Averted  Not Applicable    Life-Saving Intervention Made  Not Applicable      [facility] came to [facility] and asked to check and see if client had any insurance listed. While checking discovered that client was positive for covid and had been contacted to quarantine.. Met client outside with PPE and informed client to complete quarantine and then come back to [facility]. Client was offered help completing orange card application and meet with [facility] following ten day quarantine.  Devin Myers W. RN [phone]

## 2020-11-11 ENCOUNTER — Emergency Department (HOSPITAL_COMMUNITY): Payer: Medicaid Other

## 2020-11-11 ENCOUNTER — Other Ambulatory Visit: Payer: Self-pay

## 2020-11-11 ENCOUNTER — Encounter (HOSPITAL_COMMUNITY): Payer: Self-pay | Admitting: Emergency Medicine

## 2020-11-11 ENCOUNTER — Emergency Department (HOSPITAL_COMMUNITY)
Admission: EM | Admit: 2020-11-11 | Discharge: 2020-11-11 | Disposition: A | Discharge status: Home / Self Care | Payer: Medicaid Other | Attending: Emergency Medicine | Admitting: Emergency Medicine

## 2020-11-11 DIAGNOSIS — F17210 Nicotine dependence, cigarettes, uncomplicated: Secondary | ICD-10-CM | POA: Insufficient documentation

## 2020-11-11 DIAGNOSIS — R1031 Right lower quadrant pain: Secondary | ICD-10-CM

## 2020-11-11 DIAGNOSIS — I10 Essential (primary) hypertension: Secondary | ICD-10-CM | POA: Insufficient documentation

## 2020-11-11 DIAGNOSIS — Z7902 Long term (current) use of antithrombotics/antiplatelets: Secondary | ICD-10-CM | POA: Insufficient documentation

## 2020-11-11 DIAGNOSIS — J449 Chronic obstructive pulmonary disease, unspecified: Secondary | ICD-10-CM | POA: Diagnosis not present

## 2020-11-11 DIAGNOSIS — R160 Hepatomegaly, not elsewhere classified: Secondary | ICD-10-CM | POA: Diagnosis not present

## 2020-11-11 DIAGNOSIS — Z7951 Long term (current) use of inhaled steroids: Secondary | ICD-10-CM | POA: Diagnosis not present

## 2020-11-11 DIAGNOSIS — Z79899 Other long term (current) drug therapy: Secondary | ICD-10-CM | POA: Diagnosis not present

## 2020-11-11 LAB — CBC WITH DIFFERENTIAL/PLATELET
Abs Immature Granulocytes: 0.01 10*3/uL (ref 0.00–0.07)
Basophils Absolute: 0 10*3/uL (ref 0.0–0.1)
Basophils Relative: 1 %
Eosinophils Absolute: 0.1 10*3/uL (ref 0.0–0.5)
Eosinophils Relative: 1 %
HCT: 42.1 % (ref 39.0–52.0)
Hemoglobin: 13.8 g/dL (ref 13.0–17.0)
Immature Granulocytes: 0 %
Lymphocytes Relative: 40 %
Lymphs Abs: 2 10*3/uL (ref 0.7–4.0)
MCH: 29 pg (ref 26.0–34.0)
MCHC: 32.8 g/dL (ref 30.0–36.0)
MCV: 88.4 fL (ref 80.0–100.0)
Monocytes Absolute: 0.4 10*3/uL (ref 0.1–1.0)
Monocytes Relative: 8 %
Neutro Abs: 2.4 10*3/uL (ref 1.7–7.7)
Neutrophils Relative %: 50 %
Platelets: 217 10*3/uL (ref 150–400)
RBC: 4.76 MIL/uL (ref 4.22–5.81)
RDW: 14.2 % (ref 11.5–15.5)
WBC: 4.9 10*3/uL (ref 4.0–10.5)
nRBC: 0 % (ref 0.0–0.2)

## 2020-11-11 LAB — COMPREHENSIVE METABOLIC PANEL
ALT: 132 U/L — ABNORMAL HIGH (ref 0–44)
AST: 82 U/L — ABNORMAL HIGH (ref 15–41)
Albumin: 3.6 g/dL (ref 3.5–5.0)
Alkaline Phosphatase: 80 U/L (ref 38–126)
Anion gap: 11 (ref 5–15)
BUN: 7 mg/dL — ABNORMAL LOW (ref 8–23)
CO2: 25 mmol/L (ref 22–32)
Calcium: 9.3 mg/dL (ref 8.9–10.3)
Chloride: 105 mmol/L (ref 98–111)
Creatinine, Ser: 1.01 mg/dL (ref 0.61–1.24)
GFR, Estimated: >60 mL/min (ref >60)
Glucose, Bld: 116 mg/dL — ABNORMAL HIGH (ref 70–99)
Potassium: 3.7 mmol/L (ref 3.5–5.1)
Sodium: 141 mmol/L (ref 135–145)
Total Bilirubin: 0.9 mg/dL (ref 0.3–1.2)
Total Protein: 7.8 g/dL (ref 6.5–8.1)

## 2020-11-11 LAB — LIPASE, BLOOD: Lipase: 34 U/L (ref 11–51)

## 2020-11-11 MED ORDER — IOHEXOL 300 MG/ML  SOLN
100.0000 mL | Freq: Once | INTRAMUSCULAR | Status: AC | PRN
  Administered 2020-11-11: 100 mL via INTRAVENOUS

## 2020-11-11 MED ORDER — NAPROXEN 500 MG PO TABS: 500.0000 mg | ORAL_TABLET | Freq: Two times a day (BID) | ORAL | 0 refills | Status: DC

## 2020-11-11 NOTE — ED Notes (Signed)
Pt conversing and laughing with staff

## 2020-11-11 NOTE — Discharge Instructions (Addendum)
As discussed, your CT scan showed possible spread of liver cancer. The GI doctor will call you to schedule an appointment for follow-up. Call the office if you do not hear from them. I am sending you home with pain medication. Return to the ER for new or worsening symptoms.

## 2020-11-11 NOTE — ED Provider Notes (Signed)
[facility] EMERGENCY DEPARTMENT Provider Note   CSN: [id number] Arrival date & time: 11/11/20  1019     History Chief Complaint  Patient presents with  . Abdominal Pain    Devin Myers is a 64 y.o. male with a past medical history significant for COPD, depression, and hypertension who presents to the ED due to gradual onset of worsening right lower quadrant abdominal pain x1 day.  Patient states pain is worse with any movement.  Denies associated nausea, vomiting, diarrhea.  Last bowel movement was this morning which was normal.  Chart reviewed.  Patient had a CT abdomen on 09/09/2019 where a liver mass concerning for cancer was noted.  Patient was advised to follow-up with the bowel or GI; however, he notes he just received medicaid and has not follow-up yet. Admits to weight loss, but unsure how much. Admits to smoking marijuana for pain management. Denies urinary symptoms. Denies back pain. No history of kidney stones. No previous abdominal operations.   History obtained from patient and past medical records. No interpreter used during encounter.      Past Medical History:  Diagnosis Date  . COPD (chronic obstructive pulmonary disease) ([place])   . Depression   . Hypertension     Patient Active Problem List   Diagnosis Date Noted  . Chest pain 03/12/2019  . Shortness of breath   . Depression   . Anxiety   . 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 No pertinent surgical history.     No family history on file.  Social History   Tobacco Use  . Smoking status: Current Some Day Smoker    Types: Cigarettes  . Smokeless tobacco: Never Used  Vaping Use  . Vaping Use: Never used  Substance Use Topics  . Alcohol use: Yes    Comment: occasionally  . Drug use: Yes    Types: Marijuana    Home Medications Prior to Admission medications   Medication Sig Start Date End Date Taking? Authorizing Provider  atorvastatin  (LIPITOR) 20 MG tablet Take 1 tablet (20 mg total) by mouth daily at 6 PM. 08/12/19  Yes McClung, Angela M, PA-C  lisinopril (ZESTRIL) 20 MG tablet Take 1 tablet (20 mg total) by mouth daily. 08/12/19  Yes Argentina Donovan, PA-C  SYMBICORT 80-4.5 MCG/ACT inhaler Inhale 2 puffs into the lungs in the morning and at bedtime. 09/09/20  Yes [provider]  tamsulosin (FLOMAX) 0.4 MG CAPS capsule Take 1 capsule (0.4 mg total) by mouth daily. 05/15/19  Yes Noemi Chapel, MD  albuterol (VENTOLIN HFA) 108 (90 Base) MCG/ACT inhaler Inhale 2 puffs into the lungs every 6 (six) hours as needed for wheezing or shortness of breath. Patient not taking: Reported on 09/09/2019 08/12/19   Argentina Donovan, PA-C  DULoxetine (CYMBALTA) 60 MG capsule Take 1 capsule (60 mg total) by mouth daily. Patient not taking: Reported on 09/09/2019 08/12/19   Argentina Donovan, PA-C  HYDROcodone-acetaminophen (NORCO/VICODIN) 5-325 MG tablet Take 1 tablet by mouth every 6 (six) hours as needed for up to 15 doses. Patient not taking: Reported on 11/11/2020 09/09/19   Lennice Sites, DO  naproxen (NAPROSYN) 500 MG tablet Take 1 tablet (500 mg total) by mouth 2 (two) times daily with a meal. Patient not taking: Reported on 11/11/2020 11/07/19   Caccavale, Sophia, PA-C  naproxen (NAPROSYN) 500 MG tablet Take 1 tablet (500 mg total) by mouth 2 (two) times daily. 11/11/20  Suzy Bouchard, PA-C  Spacer/Aero-Holding Chambers (AEROCHAMBER PLUS WITH MASK) inhaler Use with inhaler 08/12/19   Freeman Caldron M, PA-C  umeclidinium bromide (INCRUSE ELLIPTA) 62.5 MCG/INH AEPB Inhale 1 puff into the lungs daily. Patient not taking: Reported on 09/09/2019 08/12/19   Argentina Donovan, PA-C    Allergies    Codeine  Review of Systems   Review of Systems  Constitutional: Negative for chills and fever.  Respiratory: Negative for shortness of breath.   Cardiovascular: Negative for chest pain.  Gastrointestinal: Positive for abdominal pain.  Negative for diarrhea, nausea and vomiting.  Genitourinary: Negative for dysuria.    Physical Exam Updated Vital Signs BP (!) 174/96   Pulse 63   Temp 98.3 F (36.8 C) (Oral)   Resp 18   SpO2 100%   Physical Exam Vitals and nursing note reviewed.  Constitutional:      General: He is not in acute distress.    Appearance: He is not ill-appearing.  HENT:     Head: Normocephalic.  Eyes:     Pupils: Pupils are equal, round, and reactive to light.  Cardiovascular:     Rate and Rhythm: Normal rate and regular rhythm.     Pulses: Normal pulses.     Heart sounds: Normal heart sounds. No murmur heard.  No friction rub. No gallop.   Pulmonary:     Effort: Pulmonary effort is normal.     Breath sounds: Normal breath sounds.  Abdominal:     General: Abdomen is flat. Bowel sounds are normal. There is no distension.     Palpations: Abdomen is soft.     Tenderness: There is abdominal tenderness. There is no guarding or rebound.     Comments: Right lower quadrant abdominal tenderness. No rebound or guarding. Negative Rovsing sign.  Musculoskeletal:     Cervical back: Neck supple.     Comments: Able to move all 4 extremities without difficulty.   Skin:    General: Skin is warm and dry.  Neurological:     General: No focal deficit present.     Mental Status: He is alert.  Psychiatric:        Mood and Affect: Mood normal.        Behavior: Behavior normal.     ED Results / Procedures / Treatments   Labs (all labs ordered are listed, but only abnormal results are displayed) Labs Reviewed  COMPREHENSIVE METABOLIC PANEL - Abnormal; Notable for the following components:      Result Value   Glucose, Bld 116 (*)    BUN 7 (*)    AST 82 (*)    ALT 132 (*)    All other components within normal limits  CBC WITH DIFFERENTIAL/PLATELET  LIPASE, BLOOD  AFP TUMOR MARKER    EKG None  Radiology CT ABDOMEN PELVIS W CONTRAST  Addendum Date: 11/11/2020   ADDENDUM REPORT: 11/11/2020  14:14 ADDENDUM: These results were called by telephone at the time of interpretation on 11/11/2020 at 2:14 pm to provider [facility] , who verbally acknowledged these results. Electronically Signed   By: Richardean Sale M.D.   On: 11/11/2020 14:14   Result Date: 11/11/2020 CLINICAL DATA:  Right lower quadrant abdominal pain since last night. EXAM: CT ABDOMEN AND PELVIS WITH CONTRAST TECHNIQUE: Multidetector CT imaging of the abdomen and pelvis was performed using the standard protocol following bolus administration of intravenous contrast. CONTRAST:  167mL OMNIPAQUE IOHEXOL 300 MG/ML  SOLN COMPARISON:  Abdominopelvic CT 09/09/2019. FINDINGS: Lower  chest: Mild subsegmental atelectasis at the right lung base. No suspicious pulmonary nodularity, pleural or pericardial effusion. There is an enlarging lymph node anterior to the heart, measuring 8 mm short axis on image 7/4. Hepatobiliary: The prior study demonstrated diffuse changes of hepatic cirrhosis with heterogeneous enhancing masses in the left hepatic lobe highly suspicious for hepatocellular carcinoma. Unfortunately, these masses have significantly progressed in the interval, nearly completely replacing the entire left hepatic lobe and measuring up to 12.2 x 7.1 cm on image 21/4. There are new anteriorly exophytic components. Vascular enhancement is less optimal the current study, and tumor thrombus in the left portal and left hepatic veins cannot be excluded. No definite involvement of the right hepatic lobe. No evidence of gallstones, gallbladder wall thickening or biliary dilatation. Pancreas: Unremarkable. No pancreatic ductal dilatation or surrounding inflammatory changes. Spleen: Normal in size without focal abnormality. Adrenals/Urinary Tract: Both adrenal glands appear normal. The kidneys appear normal without evidence of urinary tract calculus, suspicious lesion or hydronephrosis. No bladder abnormalities are seen. Stomach/Bowel: No evidence of  bowel wall thickening, distention or surrounding inflammatory change. The appendix appears normal. Vascular/Lymphatic: Stable mildly enlarged lymph nodes in the gastrohepatic ligament, measuring 10 mm on image 19/4 and 11 mm on image 23/4. Small inguinal lymph nodes bilaterally are unchanged. No progressive adenopathy. Mild aortic and branch vessel atherosclerosis. As above, possible tumor thrombus in the left portal and hepatic veins. The main portal, superior mesenteric and splenic veins are patent. Reproductive: Stable mild enlargement of the prostate gland. Other: Trace pelvic ascites without peritoneal nodularity. Small umbilical hernia containing only fat. Musculoskeletal: No acute or significant osseous findings. IMPRESSION: 1. Significant interval progression of previously demonstrated enhancing mass in the left hepatic lobe consistent with progressive hepatocellular carcinoma. There is now near complete replacement of the left hepatic lobe, with possible tumor thrombus in the left portal and left hepatic veins. 2. Stable mildly enlarged gastrohepatic ligament lymph nodes, nonspecific and possibly related to underlying cirrhosis. Enlarging precordial lymph node could reflect a metastasis. 3. No other evidence of metastatic disease or acute findings. 4. Aortic Atherosclerosis (ICD10-I70.0). Electronically Signed: By: Richardean Sale M.D. On: 11/11/2020 13:58    Procedures Procedures (including critical care time)  Medications Ordered in ED Medications  iohexol (OMNIPAQUE) 300 MG/ML solution 100 mL (100 mLs Intravenous Contrast Given 11/11/20 1329)    ED Course  I have reviewed the triage vital signs and the nursing notes.  Pertinent labs & imaging results that were available during my care of the patient were reviewed by me and considered in my medical decision making (see chart for details).    MDM Rules/Calculators/A&P                         64 year old male presents to the ED due to  right lower quadrant abdominal pain.  Chart reviewed.  Patient had a CT abdomen in September of last year which showed possible hepatocellular carcinoma.  Patient has not had any follow-up following last scan.  Upon arrival, patient is afebrile, not tachycardic or hypoxic.  Patient no acute distress and non-ill-appearing.  Physical exam significant for right lower quadrant abdominal tenderness.  No rebound or guarding.  Negative CVA tenderness bilaterally.  Will obtain routine labs and CT abdomen given previous CT readings and to rule out any intraabdominal etiologies.   CBC reassuring with no leukocytosis and normal hemoglobin.  Lipase normal at 34.  Doubt pancreatitis.  CMP significant for elevated AST  at 58 and elevated ALT at 132.  Lipase normal at 34.  CT abdomen personally reviewed which demonstrates: IMPRESSION:  1. Significant interval progression of previously demonstrated  enhancing mass in the left hepatic lobe consistent with progressive  hepatocellular carcinoma. There is now near complete replacement of  the left hepatic lobe, with possible tumor thrombus in the left  portal and left hepatic veins.  2. Stable mildly enlarged gastrohepatic ligament lymph nodes,  nonspecific and possibly related to underlying cirrhosis. Enlarging  precordial lymph node could reflect a metastasis.  3. No other evidence of metastatic disease or acute findings.  4. Aortic Atherosclerosis (ICD10-I70.0).   Discussed case with Dr. Paulita Fujita with [facility] who recommends ordering alpha fetoprotein with outpatient follow-up. Discussed at length with patient the results of the CT scan and the need to follow-up with GI. Patient states understanding. Strict ED precautions discussed with patient. Patient states understanding and agrees to plan. Patient discharged home in no acute distress and stable vitals  Final Clinical Impression(s) / ED Diagnoses Final diagnoses:  Liver mass  RLQ abdominal pain    Rx / DC  Orders ED Discharge Orders         Ordered    naproxen (NAPROSYN) 500 MG tablet  2 times daily        11/11/20 1508           Karie Kirks 11/11/20 1510    Pattricia Boss, MD 11/14/20 1251

## 2020-11-11 NOTE — ED Notes (Signed)
Patient verbalizes understanding of discharge instructions. Opportunity for questioning and answers were provided. Pt discharged from ED. 

## 2020-11-11 NOTE — ED Notes (Signed)
Pt transported to CT at this time.

## 2020-11-11 NOTE — ED Triage Notes (Signed)
C/o if lower abdominal pain since last night. Pt eating during triage.

## 2020-11-11 NOTE — ED Notes (Signed)
Pt said last night he got some good weed and all he wants now is a cheeseburger and jelly.

## 2020-11-12 LAB — AFP TUMOR MARKER: AFP, Serum, Tumor Marker: 27.4 ng/mL — ABNORMAL HIGH (ref 0.0–8.3)

## 2020-11-29 ENCOUNTER — Emergency Department (HOSPITAL_COMMUNITY)
Admission: EM | Admit: 2020-11-29 | Discharge: 2020-11-30 | Disposition: A | Discharge status: Home / Self Care | Payer: Medicaid Other | Attending: Emergency Medicine | Admitting: Emergency Medicine

## 2020-11-29 ENCOUNTER — Encounter (HOSPITAL_COMMUNITY): Payer: Self-pay

## 2020-11-29 ENCOUNTER — Other Ambulatory Visit: Payer: Self-pay

## 2020-11-29 DIAGNOSIS — R1011 Right upper quadrant pain: Secondary | ICD-10-CM | POA: Insufficient documentation

## 2020-11-29 DIAGNOSIS — J449 Chronic obstructive pulmonary disease, unspecified: Secondary | ICD-10-CM | POA: Insufficient documentation

## 2020-11-29 DIAGNOSIS — Z79899 Other long term (current) drug therapy: Secondary | ICD-10-CM | POA: Insufficient documentation

## 2020-11-29 DIAGNOSIS — R160 Hepatomegaly, not elsewhere classified: Secondary | ICD-10-CM

## 2020-11-29 DIAGNOSIS — R7401 Elevation of levels of liver transaminase levels: Secondary | ICD-10-CM | POA: Diagnosis not present

## 2020-11-29 DIAGNOSIS — F17210 Nicotine dependence, cigarettes, uncomplicated: Secondary | ICD-10-CM | POA: Insufficient documentation

## 2020-11-29 DIAGNOSIS — I10 Essential (primary) hypertension: Secondary | ICD-10-CM | POA: Diagnosis not present

## 2020-11-29 DIAGNOSIS — Z7951 Long term (current) use of inhaled steroids: Secondary | ICD-10-CM | POA: Diagnosis not present

## 2020-11-29 LAB — CBC
HCT: 43.4 % (ref 39.0–52.0)
Hemoglobin: 13.9 g/dL (ref 13.0–17.0)
MCH: 28.4 pg (ref 26.0–34.0)
MCHC: 32 g/dL (ref 30.0–36.0)
MCV: 88.8 fL (ref 80.0–100.0)
Platelets: 206 10*3/uL (ref 150–400)
RBC: 4.89 MIL/uL (ref 4.22–5.81)
RDW: 14.1 % (ref 11.5–15.5)
WBC: 5.7 10*3/uL (ref 4.0–10.5)
nRBC: 0 % (ref 0.0–0.2)

## 2020-11-29 LAB — COMPREHENSIVE METABOLIC PANEL
ALT: 127 U/L — ABNORMAL HIGH (ref 0–44)
AST: 79 U/L — ABNORMAL HIGH (ref 15–41)
Albumin: 3.9 g/dL (ref 3.5–5.0)
Alkaline Phosphatase: 89 U/L (ref 38–126)
Anion gap: 8 (ref 5–15)
BUN: 15 mg/dL (ref 8–23)
CO2: 26 mmol/L (ref 22–32)
Calcium: 9 mg/dL (ref 8.9–10.3)
Chloride: 107 mmol/L (ref 98–111)
Creatinine, Ser: 0.85 mg/dL (ref 0.61–1.24)
GFR, Estimated: >60 mL/min (ref >60)
Glucose, Bld: 105 mg/dL — ABNORMAL HIGH (ref 70–99)
Potassium: 3.9 mmol/L (ref 3.5–5.1)
Sodium: 141 mmol/L (ref 135–145)
Total Bilirubin: 0.7 mg/dL (ref 0.3–1.2)
Total Protein: 7.9 g/dL (ref 6.5–8.1)

## 2020-11-29 LAB — LIPASE, BLOOD: Lipase: 30 U/L (ref 11–51)

## 2020-11-29 MED ORDER — OXYCODONE HCL 5 MG PO TABS: 5.0000 mg | ORAL_TABLET | ORAL | 0 refills | Status: DC | PRN

## 2020-11-29 MED ORDER — ONDANSETRON HCL 4 MG PO TABS: 4.0000 mg | ORAL_TABLET | Freq: Four times a day (QID) | ORAL | 0 refills | Status: DC | PRN

## 2020-11-29 NOTE — ED Notes (Signed)
Patient states that he went to his pcp for right and left side pain and was told that he has Cancer.  Patient unable to tell this writer what type of cancer, just states that the pain goes back and forth on both sides.

## 2020-11-29 NOTE — ED Notes (Signed)
ED Provider at bedside. 

## 2020-11-29 NOTE — ED Triage Notes (Signed)
Per EMS- Patient was picked up at a gas station. Patient c/o abdominal pain. When asked about his pain, patient stated that he had weed for breakfast and asked EMS to stop and get some alcohol. Patient alerted to self only.

## 2020-11-29 NOTE — ED Provider Notes (Signed)
[facility] DEPT Provider Note   CSN: [id number] Arrival date & time: 11/29/20  1522   History Chief Complaint  Patient presents with  . Abdominal Pain    Devin Myers is a 64 y.o. male.  The history is provided by the patient.  Abdominal Pain He has history of hypertension, COPD, hepatic mass and comes in complaining of right upper quadrant pain.  He is an extremely poor historian and takes offense at just about any question that is asked.  However, it seems that he has right upper quadrant pain which sometimes radiates up to the chest and it is constant.  He has been taking ibuprofen and naproxen at home which are not helping with the pain and are tearing up his stomach.  He does have an appointment to see a gastroenterologist on January 25.  He does admit to having had some weight loss.  He denies alcohol use.  Past Medical History:  Diagnosis Date  . COPD (chronic obstructive pulmonary disease) ([place])   . Depression   . Hypertension     Patient Active Problem List   Diagnosis Date Noted  . Chest pain 03/12/2019  . Shortness of breath   . Depression   . Anxiety   . 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 No pertinent surgical history.     History reviewed. No pertinent family history.  Social History   Tobacco Use  . Smoking status: Current Some Day Smoker    Types: Cigarettes  . Smokeless tobacco: Never Used  Vaping Use  . Vaping Use: Never used  Substance Use Topics  . Alcohol use: Yes    Comment: occasionally  . Drug use: Yes    Types: Marijuana    Home Medications Prior to Admission medications   Medication Sig Start Date End Date Taking? Authorizing Provider  albuterol (VENTOLIN HFA) 108 (90 Base) MCG/ACT inhaler Inhale 2 puffs into the lungs every 6 (six) hours as needed for wheezing or shortness of breath. Patient not taking: Reported on 09/09/2019 08/12/19   Argentina Donovan,  PA-C  atorvastatin (LIPITOR) 20 MG tablet Take 1 tablet (20 mg total) by mouth daily at 6 PM. 08/12/19   Thereasa Solo, Dionne Bucy, PA-C  DULoxetine (CYMBALTA) 60 MG capsule Take 1 capsule (60 mg total) by mouth daily. Patient not taking: Reported on 09/09/2019 08/12/19   Argentina Donovan, PA-C  lisinopril (ZESTRIL) 20 MG tablet Take 1 tablet (20 mg total) by mouth daily. 08/12/19   Argentina Donovan, PA-C  naproxen (NAPROSYN) 500 MG tablet Take 1 tablet (500 mg total) by mouth 2 (two) times daily. 11/11/20   Suzy Bouchard, PA-C  ondansetron (ZOFRAN) 4 MG tablet Take 1 tablet (4 mg total) by mouth every 6 (six) hours as needed for nausea. 73/53/29   Delora Fuel, MD  oxyCODONE (ROXICODONE) 5 MG immediate release tablet Take 1 tablet (5 mg total) by mouth every 4 (four) hours as needed for severe pain. 92/42/68   Delora Fuel, MD  Spacer/Aero-Holding Chambers (AEROCHAMBER PLUS WITH MASK) inhaler Use with inhaler 08/12/19   Argentina Donovan, PA-C  SYMBICORT 80-4.5 MCG/ACT inhaler Inhale 2 puffs into the lungs in the morning and at bedtime. 09/09/20   [provider]  tamsulosin (FLOMAX) 0.4 MG CAPS capsule Take 1 capsule (0.4 mg total) by mouth daily. 05/15/19   Noemi Chapel, MD  umeclidinium bromide (INCRUSE ELLIPTA) 62.5 MCG/INH AEPB Inhale 1 puff into the lungs daily.  Patient not taking: Reported on 09/09/2019 08/12/19   Argentina Donovan, PA-C    Allergies    Codeine  Review of Systems   Review of Systems  Gastrointestinal: Positive for abdominal pain.  All other systems reviewed and are negative.   Physical Exam Updated Vital Signs BP (!) 174/102   Pulse 66   Temp 98.5 F (36.9 C) (Oral)   Resp 20   Ht 5\' 11"  (1.803 m)   Wt 68 kg   SpO2 99%   BMI 20.92 kg/m   Physical Exam Vitals and nursing note reviewed.   Somewhat cachectic 64 year old male, resting comfortably and in no acute distress. Vital signs are significant for elevated blood pressure. Oxygen saturation is 99%,  which is normal. Head is normocephalic and atraumatic. PERRLA, EOMI. Oropharynx is clear. Neck is nontender and supple without adenopathy or JVD. Back is nontender and there is no CVA tenderness. Lungs are clear without rales, wheezes, or rhonchi. Chest is nontender. Heart has regular rate and rhythm without murmur. Abdomen is soft, flat, with moderate epigastric and right upper quadrant pain.  Liver is enlarged and tender.  No other masses felt.  Peristalsis is normoactive. Extremities have no cyanosis or edema, full range of motion is present. Skin is warm and dry without rash. Neurologic: Mental status is normal, cranial nerves are intact, there are no motor or sensory deficits.  ED Results / Procedures / Treatments   Labs (all labs ordered are listed, but only abnormal results are displayed) Labs Reviewed  COMPREHENSIVE METABOLIC PANEL - Abnormal; Notable for the following components:      Result Value   Glucose, Bld 105 (*)    AST 79 (*)    ALT 127 (*)    All other components within normal limits  LIPASE, BLOOD  CBC  URINALYSIS, ROUTINE W REFLEX MICROSCOPIC   Procedures Procedures   Medications Ordered in ED Medications - No data to display  ED Course  I have reviewed the triage vital signs and the nursing notes.  Pertinent lab results that were available during my care of the patient were reviewed by me and considered in my medical decision making (see chart for details).  MDM Rules/Calculators/A&P Patient with known hepatic mass and pain related to that mass.  Old records are reviewed, and he had an ED visit on 11/26 at which time CT of abdomen showed progressive enlargement of a mass in the left hepatic lobe with possible tumor thrombus in left portal and hepatic veins.  This corresponds to where he is having tenderness.  It was difficult to get from the patient exactly what he wanted from the ED visit, but it seems to come down to need for pain control and also need to  see gastroenterologist sooner.  He is given a prescription for oxycodone to try to minimize amount of acetaminophen he is taking.  He is also given prescription for ondansetron since he has nausea as a side effect from codeine derivatives.  He is put in for an ambulatory referral to gastroenterology.  Hopefully, he can get in sooner than the currently scheduled appointment.  Final Clinical Impression(s) / ED Diagnoses Final diagnoses:  RUQ abdominal pain  Liver mass  Elevated transaminase level    Rx / DC Orders ED Discharge Orders         Ordered    oxyCODONE (ROXICODONE) 5 MG immediate release tablet  Every 4 hours PRN        11/29/20 2356  ondansetron (ZOFRAN) 4 MG tablet  Every 6 hours PRN        11/29/20 2356    Ambulatory referral to Gastroenterology       Comments: Liver mass   11/29/20 5110           Delora Fuel, MD 21/11/73 [phone]

## 2020-11-30 ENCOUNTER — Other Ambulatory Visit: Payer: Self-pay

## 2020-11-30 ENCOUNTER — Emergency Department (HOSPITAL_COMMUNITY)
Admission: EM | Admit: 2020-11-30 | Discharge: 2020-11-30 | Disposition: A | Discharge status: Home / Self Care | Payer: Medicaid Other | Source: Home / Self Care | Attending: Emergency Medicine | Admitting: Emergency Medicine

## 2020-11-30 DIAGNOSIS — R160 Hepatomegaly, not elsewhere classified: Secondary | ICD-10-CM | POA: Insufficient documentation

## 2020-11-30 DIAGNOSIS — J449 Chronic obstructive pulmonary disease, unspecified: Secondary | ICD-10-CM | POA: Insufficient documentation

## 2020-11-30 DIAGNOSIS — R1011 Right upper quadrant pain: Secondary | ICD-10-CM

## 2020-11-30 DIAGNOSIS — Z79899 Other long term (current) drug therapy: Secondary | ICD-10-CM | POA: Insufficient documentation

## 2020-11-30 DIAGNOSIS — F17210 Nicotine dependence, cigarettes, uncomplicated: Secondary | ICD-10-CM | POA: Insufficient documentation

## 2020-11-30 DIAGNOSIS — I10 Essential (primary) hypertension: Secondary | ICD-10-CM | POA: Insufficient documentation

## 2020-11-30 DIAGNOSIS — Z7951 Long term (current) use of inhaled steroids: Secondary | ICD-10-CM | POA: Insufficient documentation

## 2020-11-30 LAB — CBC WITH DIFFERENTIAL/PLATELET
Abs Immature Granulocytes: 0.01 10*3/uL (ref 0.00–0.07)
Basophils Absolute: 0 10*3/uL (ref 0.0–0.1)
Basophils Relative: 1 %
Eosinophils Absolute: 0.1 10*3/uL (ref 0.0–0.5)
Eosinophils Relative: 2 %
HCT: 40.6 % (ref 39.0–52.0)
Hemoglobin: 14 g/dL (ref 13.0–17.0)
Immature Granulocytes: 0 %
Lymphocytes Relative: 47 %
Lymphs Abs: 2.2 10*3/uL (ref 0.7–4.0)
MCH: 29.9 pg (ref 26.0–34.0)
MCHC: 34.5 g/dL (ref 30.0–36.0)
MCV: 86.6 fL (ref 80.0–100.0)
Monocytes Absolute: 0.5 10*3/uL (ref 0.1–1.0)
Monocytes Relative: 10 %
Neutro Abs: 1.8 10*3/uL (ref 1.7–7.7)
Neutrophils Relative %: 40 %
Platelets: 222 10*3/uL (ref 150–400)
RBC: 4.69 MIL/uL (ref 4.22–5.81)
RDW: 14.2 % (ref 11.5–15.5)
WBC: 4.7 10*3/uL (ref 4.0–10.5)
nRBC: 0 % (ref 0.0–0.2)

## 2020-11-30 LAB — HEPATIC FUNCTION PANEL
ALT: 138 U/L — ABNORMAL HIGH (ref 0–44)
AST: 78 U/L — ABNORMAL HIGH (ref 15–41)
Albumin: 3.6 g/dL (ref 3.5–5.0)
Alkaline Phosphatase: 83 U/L (ref 38–126)
Bilirubin, Direct: 0.1 mg/dL (ref 0.0–0.2)
Indirect Bilirubin: 0.8 mg/dL (ref 0.3–0.9)
Total Bilirubin: 0.9 mg/dL (ref 0.3–1.2)
Total Protein: 8.1 g/dL (ref 6.5–8.1)

## 2020-11-30 LAB — LIPASE, BLOOD: Lipase: 27 U/L (ref 11–51)

## 2020-11-30 NOTE — Social Work (Signed)
CSW coordinated transportation via [employer]

## 2020-11-30 NOTE — ED Triage Notes (Signed)
Arrived via EMS; reported recently dx w/ cancer. C/O swelling on left groin as well as flank and abdominal pain tenderness and pain that comes and go in nature; Patient stated he needs a case worker as well to help him with out patient cancer care.

## 2020-11-30 NOTE — Discharge Instructions (Addendum)
Follow up with the gastroenterologist.  Return if you are having any problems.

## 2020-11-30 NOTE — Progress Notes (Addendum)
   11/30/20 1943  TOC ED Mini Assessment  TOC Time spent with patient (minutes): 20  PING Used in TOC Assessment No  Admission or Readmission Diverted Yes  Interventions which prevented an admission or readmission Follow-up medical appointment  What brought you to the Emergency Department?  Abdominal Pains  Barrier interventions ED RNCM will set up appointment with [facility] and assist with establsihing care with a PCP Discussed [place], CM will contact [facility]'s CM to follow patient for community needs  Means of departure Taxi  ED CM will follow up with patient tomorrow to secure next available GI appointment with [facility] with Dr. Collene Mares. Will forward information to [facility] Transitional Care Team.  Discussed with patient verbalized understanding teach back done. Updated EDP.

## 2020-11-30 NOTE — Discharge Instructions (Signed)
As discussed, it is very important that you follow-up both with our specialist and with your primary care clinic.  Please call Dr. Collene Mares tomorrow to arrange appropriate close gastroenterology follow-up.  Please call our health and wellness center to establish care with a primary care physician.  Return here for concerning changes in your condition.

## 2020-11-30 NOTE — ED Provider Notes (Signed)
[facility] EMERGENCY DEPARTMENT Provider Note   CSN: [id number] Arrival date & time: 11/30/20  1226     History Chief Complaint  Patient presents with  . Abdominal Pain    Axxel Gude is a 64 y.o. male.  HPI Presents with abdominal pain.  Pain is right-sided, upper, waxing and waning in severity. There was potential increase during an episode that occurred just prior to ED arrival. Patient has been seen and evaluated for this pain, both earlier today, and over the past year. Patient has known liver mass, which she acknowledges was described to him about 1 year ago.  He has not yet followed up with a gastroenterologist, though on chart review it is clear that arrangements were made for this during his initial evaluation for the pain slightly more than 1 year ago. Patient was seen and evaluated earlier this week, notes the pain is currently about the same as it was earlier, though he did have an exacerbation as above.  Currently pain is minimal, but clear, sharp, right-sided, with radiation towards the groin.  He has been using ibuprofen and Naprosyn, without appreciable change.  Is unclear why he has not yet followed up with gastroenterology, though he notes some difficulty navigating the system. No other new changes, recently, or since yesterday.    Past Medical History:  Diagnosis Date  . COPD (chronic obstructive pulmonary disease) ([place])   . Depression   . Hypertension     Patient Active Problem List   Diagnosis Date Noted  . Chest pain 03/12/2019  . Shortness of breath   . Depression   . Anxiety   . 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     No family history on file.  Social History   Tobacco Use  . Smoking status: Current Some Day Smoker    Types: Cigarettes  . Smokeless tobacco: Never Used  Vaping Use  . Vaping Use: Never used  Substance Use Topics  . Alcohol use: Yes     Comment: occasionally  . Drug use: Yes    Types: Marijuana    Home Medications Prior to Admission medications   Medication Sig Start Date End Date Taking? Authorizing Provider  albuterol (VENTOLIN HFA) 108 (90 Base) MCG/ACT inhaler Inhale 2 puffs into the lungs every 6 (six) hours as needed for wheezing or shortness of breath. Patient not taking: Reported on 09/09/2019 08/12/19   Argentina Donovan, PA-C  atorvastatin (LIPITOR) 20 MG tablet Take 1 tablet (20 mg total) by mouth daily at 6 PM. 08/12/19   Thereasa Solo, Dionne Bucy, PA-C  DULoxetine (CYMBALTA) 60 MG capsule Take 1 capsule (60 mg total) by mouth daily. Patient not taking: Reported on 09/09/2019 08/12/19   Argentina Donovan, PA-C  lisinopril (ZESTRIL) 20 MG tablet Take 1 tablet (20 mg total) by mouth daily. 08/12/19   Argentina Donovan, PA-C  naproxen (NAPROSYN) 500 MG tablet Take 1 tablet (500 mg total) by mouth 2 (two) times daily. 11/11/20   Suzy Bouchard, PA-C  ondansetron (ZOFRAN) 4 MG tablet Take 1 tablet (4 mg total) by mouth every 6 (six) hours as needed for nausea. 90/30/09   Delora Fuel, MD  oxyCODONE (ROXICODONE) 5 MG immediate release tablet Take 1 tablet (5 mg total) by mouth every 4 (four) hours as needed for severe pain. 23/30/07   Delora Fuel, MD  Spacer/Aero-Holding Chambers (AEROCHAMBER PLUS WITH MASK) inhaler Use with inhaler 08/12/19  Freeman Caldron M, PA-C  SYMBICORT 80-4.5 MCG/ACT inhaler Inhale 2 puffs into the lungs in the morning and at bedtime. 09/09/20   [provider]  tamsulosin (FLOMAX) 0.4 MG CAPS capsule Take 1 capsule (0.4 mg total) by mouth daily. 05/15/19   Noemi Chapel, MD  umeclidinium bromide (INCRUSE ELLIPTA) 62.5 MCG/INH AEPB Inhale 1 puff into the lungs daily. Patient not taking: Reported on 09/09/2019 08/12/19   Argentina Donovan, PA-C    Allergies    Codeine  Review of Systems   Review of Systems  Constitutional:       Per HPI, otherwise negative  HENT:       Per HPI, otherwise  negative  Respiratory:       Per HPI, otherwise negative  Cardiovascular:       Per HPI, otherwise negative  Gastrointestinal: Positive for abdominal pain. Negative for vomiting.  Endocrine:       Negative aside from HPI  Genitourinary:       Neg aside from HPI   Musculoskeletal:       Per HPI, otherwise negative  Skin: Negative.   Neurological: Positive for weakness. Negative for syncope.    Physical Exam Updated Vital Signs BP (!) 171/94   Pulse 65   Temp 98.9 F (37.2 C) (Oral)   Resp 12   SpO2 97%   Physical Exam Vitals and nursing note reviewed.  Constitutional:      General: He is not in acute distress.    Appearance: He is well-developed.  HENT:     Head: Normocephalic and atraumatic.  Eyes:     Extraocular Movements: EOM normal.     Conjunctiva/sclera: Conjunctivae normal.  Cardiovascular:     Rate and Rhythm: Normal rate and regular rhythm.  Pulmonary:     Effort: Pulmonary effort is normal. No respiratory distress.     Breath sounds: No stridor.  Abdominal:     General: There is no distension.  Musculoskeletal:        General: No edema.  Skin:    General: Skin is warm and dry.  Neurological:     Mental Status: He is alert and oriented to person, place, and time.  Psychiatric:        Mood and Affect: Mood and affect normal.     Comments: Repetitive rapid speech.     ED Results / Procedures / Treatments   Labs (all labs ordered are listed, but only abnormal results are displayed) Labs Reviewed  HEPATIC FUNCTION PANEL - Abnormal; Notable for the following components:      Result Value   AST 78 (*)    ALT 138 (*)    All other components within normal limits  LIPASE, BLOOD  CBC WITH DIFFERENTIAL/PLATELET    Radiology CT scan from end of last month notable for progression of disease, compared to study from last year.  IMPRESSION: 1. Significant interval progression of previously demonstrated enhancing mass in the left hepatic lobe consistent  with progressive hepatocellular carcinoma. There is now near complete replacement of the left hepatic lobe, with possible tumor thrombus in the left portal and left hepatic veins. 2. Stable mildly enlarged gastrohepatic ligament lymph nodes, nonspecific and possibly related to underlying cirrhosis. Enlarging precordial lymph node could reflect a metastasis. 3. No other evidence of metastatic disease or acute findings. 4. Aortic Atherosclerosis (ICD10-I70.0).   Electronically Signed: By: Richardean Sale M.D. On: 11/11/2020 13:58  Procedures Procedures (including critical care time)  Medications Ordered in ED  Medications - No data to display  ED Course  I have reviewed the triage vital signs and the nursing notes.  Pertinent labs & imaging results that were available during my care of the patient were reviewed by me and considered in my medical decision making (see chart for details).  After initial evaluation patient had labs ordered, I reviewed his chart, notable findings as above. As I discussed patient's case with our case management team to arrange discrete follow-up for his likely hepatocellular malignancy which has been present for at least 1 year, seemingly.   Update:, Patient in no distress. Labs reviewed, discussed, no substantial progression over the past 48 hours. Remains hemodynamically unremarkable. After having discussed case with social work, having been arrangements for appropriate follow-up, the patient is appropriate for discharge with close outpatient evaluation for his likely malignancy.  Final Clinical Impression(s) / ED Diagnoses Final diagnoses:  Right upper quadrant abdominal pain  Liver mass     Carmin Muskrat, MD 11/30/20 2007

## 2020-12-01 ENCOUNTER — Telehealth: Payer: Self-pay | Admitting: Surgery

## 2020-12-01 NOTE — Telephone Encounter (Signed)
ED CM attempted to reach patient but spoke patient's daughter Deontra Pereyra [phone] with appointment with [facility] 12/21 at 10:10 am, and provided her with information to contact [facility] to establish primary care. Daughter states she will drive over and give information to patient in the morning

## 2020-12-02 ENCOUNTER — Telehealth: Payer: Self-pay | Admitting: Gastroenterology

## 2020-12-02 ENCOUNTER — Other Ambulatory Visit: Payer: Self-pay

## 2020-12-02 DIAGNOSIS — R1011 Right upper quadrant pain: Secondary | ICD-10-CM

## 2020-12-02 DIAGNOSIS — R110 Nausea: Secondary | ICD-10-CM

## 2020-12-02 NOTE — Telephone Encounter (Signed)
This patient is on my office schedule for 12/06/2020.  His chart was brought to me today out of concern for the recent ED visits and CT scan findings, questioning whether the patient would be best served by a visit at our office or oncology.  Chart review indicates he has been in the ED several times over the last few weeks with escalating right upper quadrant pain and nausea related to a large malignant appearing mass in the liver.  It was initially discovered over a year ago and has now significantly progressed. The 3 recent ED visits have all recommended outpatient GI follow-up, including after conversation with the on-call [facility] GI physician on November 26.  Note from a care coordinator this week seems to have arranged a visit with [place] next week.  I would like to be helpful to this patient as best I can, but what he really needs is an office visit with oncology next week, rather than a visit at our office.  It is a very clearly malignant appearing mass on imaging with possible vascular thrombosis and escalating symptoms.  It will need biopsy and oncologic care.  Please get in touch with the cancer referral coordinator to make them aware of this case and request help expediting a visit with an oncologist next week.  Communication with the individual who helped arrange this appointment (due to the patient's apparent difficulty with health literacy and navigating health care) would also be helpful to coordinate care.  - H. Loletha Carrow, MD

## 2020-12-02 NOTE — Telephone Encounter (Signed)
-   Urgent referral to oncology in epic - Spoke with Malachy Mood , nurse navigator for Oncology, in regards to patient's case as outlined below. Malachy Mood will review patient's chart and see when patient can be scheduled with medical oncologist. She states that she will send me a message once patient has been scheduled, she is aware that an urgent referral has been placed in epic as well.  - Will await appointment information and will contact patient with this information.

## 2020-12-05 NOTE — Telephone Encounter (Addendum)
Called patient twice, someone picks up and does not say anything, then hangs up.   Spoke with patient's daughter, Devin Myers, in regards to the information below. She is aware that patient is scheduled to see Dr. Burr Medico on 12/19/2020 at 3 PM. Advised daughter that patient may be receiving a call from oncology as well with the appt information. Daughter is aware that Dr. Loletha Carrow will be managing patient until he can see oncology. Advised daughter that I tried to reach patient but I was unable to speak with him, she stated that it was fine and thanked me for the call. She verbalized understanding of all information and had no other concerns at the end of the call.

## 2020-12-06 ENCOUNTER — Other Ambulatory Visit (INDEPENDENT_AMBULATORY_CARE_PROVIDER_SITE_OTHER): Payer: Medicaid Other

## 2020-12-06 ENCOUNTER — Ambulatory Visit (INDEPENDENT_AMBULATORY_CARE_PROVIDER_SITE_OTHER): Payer: Medicaid Other | Admitting: Gastroenterology

## 2020-12-06 ENCOUNTER — Encounter: Payer: Self-pay | Admitting: Gastroenterology

## 2020-12-06 VITALS — BP 150/90 | HR 78 | Ht 71.0 in | Wt 147.6 lb

## 2020-12-06 DIAGNOSIS — R634 Abnormal weight loss: Secondary | ICD-10-CM

## 2020-12-06 DIAGNOSIS — R1011 Right upper quadrant pain: Secondary | ICD-10-CM

## 2020-12-06 DIAGNOSIS — R160 Hepatomegaly, not elsewhere classified: Secondary | ICD-10-CM | POA: Diagnosis not present

## 2020-12-06 LAB — PROTIME-INR
INR: 1.1 ratio — ABNORMAL HIGH (ref 0.8–1.0)
Prothrombin Time: 12.7 s (ref 9.6–13.1)

## 2020-12-06 MED ORDER — OXYCODONE HCL 5 MG PO TABS: 5.0000 mg | ORAL_TABLET | Freq: Four times a day (QID) | ORAL | 0 refills | Status: AC | PRN

## 2020-12-06 NOTE — Patient Instructions (Addendum)
We have sent the following medications to your pharmacy for you: oxycodone.  Your provider has requested that you go to the basement level for lab work before leaving today. Press "B" on the elevator. The lab is located at the first door on the left as you exit the elevator.  We have placed a referral for you to have a liver biopsy. You should be receiving a call from radiology with an appointment within the next several days. If you do not receive a call within the next 3 days, please call our office at [phone].  If you are age 63 or younger, your body mass index should be between 19-25. Your Body mass index is 20.59 kg/m. If this is out of the aformentioned range listed, please consider follow up with your Primary Care Provider.   Due to recent changes in healthcare laws, you may see the results of your imaging and laboratory studies on MyChart before your provider has had a chance to review them.  We understand that in some cases there may be results that are confusing or concerning to you. Not all laboratory results come back in the same time frame and the provider may be waiting for multiple results in order to interpret others.  Please give [place] 48 hours in order for your provider to thoroughly review all the results before contacting the office for clarification of your results.   Thank you for entrusting me and [facility] with your care.  Dr. Loletha Carrow

## 2020-12-06 NOTE — Progress Notes (Signed)
[facility] Consult Note:  History: Devin Myers 12/06/2020  Referring provider: Marliss Coots, NP  Reason for consult/chief complaint: Abdominal Pain (Right sided abdominal pain with loss of appetite; notes he has lost 10 pounds very recently without trying; denies any nausea or vomiting; no rectal bleeding or diarrhea)   Subjective  HPI: This is a 64 year old man referred to [place] after recent emergency department visits.  He was in the [facility] long ED September 2020 with right upper quadrant pain, and a liver mass was discovered on CT scan.  He was referred to our clinic for follow-up but did not attend a visit for unclear reasons.  He was in our emergency department on November 26 with worsening upper abdominal pain, CT scan found significant enlargement of this mass worrisome for malignancy as before.  ED provider note indicates contact with the [place] on-call who recommended an AFP and outpatient follow-up, though patient was subsequently not seen in any GI office for unclear reasons.  He was back in our ED 2 days last week with worsening of this pain and a clinic visit was scheduled for today.  I reviewed his chart last week and felt this patient needed to be seen urgently by oncology, we contacted their referral coordinator and the soonest they were able to offer an appointment was 12/19/20.  Devin Myers was here with his daughter Devin Myers, who only recently learned that her father was having these issues. Devin Myers has had chronic right upper quadrant pain, nausea, anorexia and weight loss which is escalated in recent weeks. He denies constipation diarrhea or rectal bleeding. Beyond that, he is a very limited historian.  When asked about history of liver disease or hepatitis, Devin Myers seem to recall a provider in [place] saying that Devin Myers had hepatitis and possibly cirrhosis. He drinks alcohol regularly, and it sounds like probably heavily at least at some point in  the past. He uses marijuana regularly as well.  (30 minutes late for today's appointment) ROS:  Review of Systems  Constitutional: Negative for appetite change and unexpected weight change.  HENT: Negative for mouth sores and voice change.   Eyes: Negative for pain and redness.  Respiratory: Positive for cough and shortness of breath.   Cardiovascular: Negative for chest pain and palpitations.  Genitourinary: Negative for dysuria and hematuria.  Musculoskeletal: Negative for arthralgias and myalgias.  Skin: Negative for pallor and rash.  Neurological: Negative for weakness and headaches.  Hematological: Negative for adenopathy.  Psychiatric/Behavioral:       Depression     Past Medical History: Past Medical History:  Diagnosis Date  . COPD (chronic obstructive pulmonary disease) ([place])   . Depression   . Hypertension      Past Surgical History: History reviewed. No pertinent surgical history.   Family History: Family History  Problem Relation Age of Onset  . Inflammatory bowel disease Daughter   . Colon cancer Neg Hx   . Esophageal cancer Neg Hx   . Pancreatic cancer Neg Hx   . Stomach cancer Neg Hx   . Liver disease Neg Hx     Social History: Social History   Socioeconomic History  . Marital status: Divorced    Spouse name: Not on file  . Number of children: Not on file  . Years of education: Not on file  . Highest education level: Not on file  Occupational History  . Not on file  Tobacco Use  . Smoking status: Current Some Day Smoker  Types: Cigarettes  . Smokeless tobacco: Never Used  Vaping Use  . Vaping Use: Never used  Substance and Sexual Activity  . Alcohol use: Yes    Comment: occasionally  . Drug use: Yes    Types: Marijuana  . Sexual activity: Not Currently  Other Topics Concern  . Not on file  Social History Narrative  . Not on file   Social Determinants of Health   Financial Resource Strain: Not on file  Food Insecurity: Not on  file  Transportation Needs: Not on file  Physical Activity: Not on file  Stress: Not on file  Social Connections: Not on file    Allergies: Allergies  Allergen Reactions  . Codeine Nausea And Vomiting    Outpatient Meds: Current Outpatient Medications  Medication Sig Dispense Refill  . albuterol (VENTOLIN HFA) 108 (90 Base) MCG/ACT inhaler Inhale 2 puffs into the lungs every 6 (six) hours as needed for wheezing or shortness of breath. 18 g 1  . SYMBICORT 80-4.5 MCG/ACT inhaler Inhale 2 puffs into the lungs in the morning and at bedtime.    Marland Kitchen atorvastatin (LIPITOR) 20 MG tablet Take 1 tablet (20 mg total) by mouth daily at 6 PM. (Patient not taking: Reported on 12/06/2020) 30 tablet 3  . DULoxetine (CYMBALTA) 60 MG capsule Take 1 capsule (60 mg total) by mouth daily. (Patient not taking: No sig reported) 30 capsule 1  . lisinopril (ZESTRIL) 20 MG tablet Take 1 tablet (20 mg total) by mouth daily. (Patient not taking: Reported on 12/06/2020) 90 tablet 1  . ondansetron (ZOFRAN) 4 MG tablet Take 1 tablet (4 mg total) by mouth every 6 (six) hours as needed for nausea. (Patient not taking: Reported on 12/06/2020) 20 tablet 0  . oxyCODONE (ROXICODONE) 5 MG immediate release tablet Take 1 tablet (5 mg total) by mouth every 6 (six) hours as needed for up to 10 days for severe pain. 30 tablet 0  . Spacer/Aero-Holding Chambers (AEROCHAMBER PLUS WITH MASK) inhaler Use with inhaler (Patient not taking: Reported on 12/06/2020) 1 each 0  . tamsulosin (FLOMAX) 0.4 MG CAPS capsule Take 1 capsule (0.4 mg total) by mouth daily. (Patient not taking: Reported on 12/06/2020) 30 capsule 0  . umeclidinium bromide (INCRUSE ELLIPTA) 62.5 MCG/INH AEPB Inhale 1 puff into the lungs daily. (Patient not taking: No sig reported) 30 each 0   No current facility-administered medications for this visit.      ___________________________________________________________________ Objective   Exam:  BP (!) 150/90    Pulse 78   Ht 5\' 11"  (1.803 m)   Wt 147 lb 9.6 oz (67 kg)   SpO2 98%   BMI 20.59 kg/m    General: Thin, chronically ill-appearing man. He is ambulatory, gets on exam table without difficulty, breathing comfortably on room air. Intermittent cough  Eyes: sclera anicteric, no redness  ENT: oral mucosa moist without lesions, no cervical or supraclavicular lymphadenopathy  CV: RRR without murmur, S1/S2, no JVD, no peripheral edema  Resp: Globally decreased breath sounds bilaterally, normal RR and effort noted  GI: soft, +RUQ tenderness, with active bowel sounds. Small umbilical hernia  Liver is felt 4 fingerbreadths below costal margin, firm and tender  Skin; warm and dry, no rash or jaundice noted  Neuro: awake, alert and oriented x 3. Normal gross motor function and fluent speech  Labs:  CBC Latest Ref Rng & Units 11/30/2020 11/29/2020 11/11/2020  WBC 4.0 - 10.5 K/uL 4.7 5.7 4.9  Hemoglobin 13.0 - 17.0 g/dL 14.0 13.9  13.8  Hematocrit 39.0 - 52.0 % 40.6 43.4 42.1  Platelets 150 - 400 K/uL 222 206 217    CMP Latest Ref Rng & Units 11/30/2020 11/29/2020 11/11/2020  Glucose 70 - 99 mg/dL - 105(H) 116(H)  BUN 8 - 23 mg/dL - 15 7(L)  Creatinine 0.61 - 1.24 mg/dL - 0.85 1.01  Sodium 135 - 145 mmol/L - 141 141  Potassium 3.5 - 5.1 mmol/L - 3.9 3.7  Chloride 98 - 111 mmol/L - 107 105  CO2 22 - 32 mmol/L - 26 25  Calcium 8.9 - 10.3 mg/dL - 9.0 9.3  Total Protein 6.5 - 8.1 g/dL 8.1 7.9 7.8  Total Bilirubin 0.3 - 1.2 mg/dL 0.9 0.7 0.9  Alkaline Phos 38 - 126 U/L 83 89 80  AST 15 - 41 U/L 78(H) 79(H) 82(H)  ALT 0 - 44 U/L 138(H) 127(H) 132(H)   No INR on file  AFP = 27.4 on 11/11/20  Radiologic Studies:  CLINICAL DATA:  Right lower quadrant abdominal pain since last night.   EXAM: CT ABDOMEN AND PELVIS WITH CONTRAST   TECHNIQUE: Multidetector CT imaging of the abdomen and pelvis was performed using the standard protocol following bolus administration of intravenous  contrast.   CONTRAST:  110mL OMNIPAQUE IOHEXOL 300 MG/ML  SOLN   COMPARISON:  Abdominopelvic CT 09/09/2019.   FINDINGS: Lower chest: Mild subsegmental atelectasis at the right lung base. No suspicious pulmonary nodularity, pleural or pericardial effusion. There is an enlarging lymph node anterior to the heart, measuring 8 mm short axis on image 7/4.   Hepatobiliary: The prior study demonstrated diffuse changes of hepatic cirrhosis with heterogeneous enhancing masses in the left hepatic lobe highly suspicious for hepatocellular carcinoma. Unfortunately, these masses have significantly progressed in the interval, nearly completely replacing the entire left hepatic lobe and measuring up to 12.2 x 7.1 cm on image 21/4. There are new anteriorly exophytic components. Vascular enhancement is less optimal the current study, and tumor thrombus in the left portal and left hepatic veins cannot be excluded. No definite involvement of the right hepatic lobe. No evidence of gallstones, gallbladder wall thickening or biliary dilatation.   Pancreas: Unremarkable. No pancreatic ductal dilatation or surrounding inflammatory changes.   Spleen: Normal in size without focal abnormality.   Adrenals/Urinary Tract: Both adrenal glands appear normal. The kidneys appear normal without evidence of urinary tract calculus, suspicious lesion or hydronephrosis. No bladder abnormalities are seen.   Stomach/Bowel: No evidence of bowel wall thickening, distention or surrounding inflammatory change. The appendix appears normal.   Vascular/Lymphatic: Stable mildly enlarged lymph nodes in the gastrohepatic ligament, measuring 10 mm on image 19/4 and 11 mm on image 23/4. Small inguinal lymph nodes bilaterally are unchanged. No progressive adenopathy. Mild aortic and branch vessel atherosclerosis. As above, possible tumor thrombus in the left portal and hepatic veins. The main portal, superior mesenteric  and splenic veins are patent.   Reproductive: Stable mild enlargement of the prostate gland.   Other: Trace pelvic ascites without peritoneal nodularity. Small umbilical hernia containing only fat.   Musculoskeletal: No acute or significant osseous findings.   IMPRESSION: 1. Significant interval progression of previously demonstrated enhancing mass in the left hepatic lobe consistent with progressive hepatocellular carcinoma. There is now near complete replacement of the left hepatic lobe, with possible tumor thrombus in the left portal and left hepatic veins. 2. Stable mildly enlarged gastrohepatic ligament lymph nodes, nonspecific and possibly related to underlying cirrhosis. Enlarging precordial lymph node could reflect a metastasis.  3. No other evidence of metastatic disease or acute findings. 4. Aortic Atherosclerosis (ICD10-I70.0).   Electronically Signed: By: Richardean Sale M.D. On: 11/11/2020 13:58 _____________________________________  CLINICAL DATA:  Abdominal Distension LLQ pain with nausea x 2 days 100 ml omni 300^138mL OMNIPAQUE IOHEXOL 300 MG/ML SOLN   EXAM: CT ABDOMEN AND PELVIS WITH CONTRAST   TECHNIQUE: Multidetector CT imaging of the abdomen and pelvis was performed using the standard protocol following bolus administration of intravenous contrast.   CONTRAST:  182mL OMNIPAQUE IOHEXOL 300 MG/ML  SOLN   COMPARISON:  None.   FINDINGS: Lower chest: No acute abnormality.   Hepatobiliary: The liver is mildly shrunken with nodular contour compatible with cirrhosis. In the superior left hepatic lobe there are multiple confluent rim enhancing masses overall spanning 7.3 x 2.8 cm (series 5, image 31). There are a few prominent periportal lymph nodes measuring up to 1.0 cm in diameter which are indeterminate. No gallstones, gallbladder wall thickening, or biliary dilatation.   Pancreas: Unremarkable. No pancreatic ductal dilatation or surrounding  inflammatory changes.   Spleen: Normal in size without focal abnormality.   Adrenals/Urinary Tract: Adrenal glands are unremarkable. Kidneys are normal, without renal calculi, focal lesion, or hydronephrosis. Bladder is unremarkable.   Stomach/Bowel: Stomach is within normal limits. Appendix appears normal. No evidence of bowel wall thickening, distention, or inflammatory changes. There are a few scattered colonic diverticula without evidence of diverticulitis.   Vascular/Lymphatic: Minimal scattered atherosclerotic calcification in the abdominal aorta. No evidence of aneurysm.   Reproductive: Prostate is unremarkable.   Other: No abdominal wall hernia or abnormality. No abdominopelvic ascites.   Musculoskeletal: No acute or significant osseous findings.   IMPRESSION: 1. Hepatic cirrhosis with multiple confluent rim enhancing masses within the superior left hepatic lobe. Findings are concerning for hepatocellular carcinoma or possibly cholangiocarcinoma. Recommend GI referral. 2. Mildly prominent periportal lymph nodes measuring up to 1.0 cm in diameter which are indeterminate. 3. No CT finding to explain the patient's lower abdominal pain.   Aortic Atherosclerosis (ICD10-I70.0).   These results were called by telephone at the time of interpretation on 09/09/2019 at 3:11 pm to provider ADAM CURATOLO , who verbally acknowledged these results.     Electronically Signed   By: Audie Pinto M.D.   On: 09/09/2019 15:12  (Recent CT scan images personally reviewed)  Assessment: Encounter Diagnoses  Name Primary?  . Liver mass Yes  . RUQ pain   . Weight loss     This is almost certainly hepatocellular carcinoma with underlying cirrhosis. It is an advanced malignancy and imaging suggests that there is possible tumor thrombus in the portal vein. He does not have ascites either on exam nor on this recent CT scan.  I had a frank discussion with Devin Myers and his daughter about  what is happening. The AFP was only mildly elevated, so I feel he needs a biopsy to confirm tissue diagnosis before upcoming oncology appointment on January 3. Devin Myers was tearful after receiving my diagnosis, we had a long talk about the fact this is not medical to surgery and probably not curable with chemotherapy but perhaps some treatment may relieve his symptoms and improve quality of life.  If he is significantly coagulopathic, then we may need to forego the biopsy and ask oncology to treat empirically.  Plan: PT/INR today  Liver biopsy with interventional radiology will be arranged soon as possible. Keep oncology appointment on January 3 Patient was prescribed oxycodone at his ED visit last week, but his  daughter says he did not fill the prescription. After checking as needed P, I sent a new prescription for 30 tablets of oxycodone 5 mg to take one at most every 6 hours as needed for pain. I would like him to use it sparingly, and pain control can be managed by oncology after their evaluation.   Thank you for the courtesy of this consult.  Please call me with any questions or concerns. (Total 60-minute time with extensive chart review and patient/family discussion)  Nelida Meuse III  CC: Referring provider noted above

## 2020-12-07 ENCOUNTER — Telehealth: Payer: Self-pay

## 2020-12-08 ENCOUNTER — Encounter (HOSPITAL_COMMUNITY): Payer: Self-pay

## 2020-12-08 ENCOUNTER — Telehealth: Payer: Self-pay | Admitting: Gastroenterology

## 2020-12-08 NOTE — Progress Notes (Unsigned)
[place] Caller "[name]" Male, 64 y.o., 05/31/1956  MRN:  [id number] Phone:  [phone] Jerilynn Mages)       PCP:  Marliss Coots, NP Coverage:  Medicaid [place]/Medicaid Of   Next Appt With Radiology ([facility] 2) 12/13/2020 at 1:00 PM           RE: Biopsy Received: 2 days ago  Message Details  Arne Cleveland, MD  Lenore Cordia Ok   [place] core L liver mass    [facility]    Previous Messages  ----- Message -----  From: Lenore Cordia  Sent: 12/06/2020 12:12 PM EST  To: [facility] Procedure Requests  Subject: Biopsy                      Procedure Requested: US Biopsy (liver)    Reason for Procedure: liver mass    Provider Requesting: Nelida Meuse III  Provider Telephone: [phone]    Other Info:

## 2020-12-08 NOTE — Telephone Encounter (Signed)
Inbound call from patient's daughter requesting a call back please.  States patient received a call in regards to appointment scheduled on 12/13/20 for his biopsy but does not remember what was said to him.  Can you please call daughter to verify instructions for procedure.

## 2020-12-08 NOTE — Telephone Encounter (Signed)
Referred daughter to call radiology scheduling as they have the information and instructions for this procedure.

## 2020-12-09 ENCOUNTER — Encounter (HOSPITAL_COMMUNITY): Payer: Self-pay | Admitting: Emergency Medicine

## 2020-12-09 ENCOUNTER — Emergency Department (HOSPITAL_COMMUNITY)
Admission: EM | Admit: 2020-12-09 | Discharge: 2020-12-09 | Disposition: A | Discharge status: Home / Self Care | Payer: Medicaid Other | Attending: Emergency Medicine | Admitting: Emergency Medicine

## 2020-12-09 ENCOUNTER — Emergency Department (HOSPITAL_COMMUNITY): Payer: Medicaid Other

## 2020-12-09 ENCOUNTER — Other Ambulatory Visit: Payer: Self-pay

## 2020-12-09 DIAGNOSIS — Z7951 Long term (current) use of inhaled steroids: Secondary | ICD-10-CM | POA: Diagnosis not present

## 2020-12-09 DIAGNOSIS — R1031 Right lower quadrant pain: Secondary | ICD-10-CM | POA: Insufficient documentation

## 2020-12-09 DIAGNOSIS — F17210 Nicotine dependence, cigarettes, uncomplicated: Secondary | ICD-10-CM | POA: Insufficient documentation

## 2020-12-09 DIAGNOSIS — Z79899 Other long term (current) drug therapy: Secondary | ICD-10-CM | POA: Insufficient documentation

## 2020-12-09 DIAGNOSIS — I10 Essential (primary) hypertension: Secondary | ICD-10-CM | POA: Insufficient documentation

## 2020-12-09 DIAGNOSIS — J449 Chronic obstructive pulmonary disease, unspecified: Secondary | ICD-10-CM | POA: Insufficient documentation

## 2020-12-09 LAB — URINALYSIS, ROUTINE W REFLEX MICROSCOPIC
Bilirubin Urine: NEGATIVE
Glucose, UA: NEGATIVE mg/dL
Hgb urine dipstick: NEGATIVE
Ketones, ur: NEGATIVE mg/dL
Leukocytes,Ua: NEGATIVE
Nitrite: NEGATIVE
Protein, ur: NEGATIVE mg/dL
Specific Gravity, Urine: 1.019 (ref 1.005–1.030)
pH: 6 (ref 5.0–8.0)

## 2020-12-09 LAB — CBC WITH DIFFERENTIAL/PLATELET
Abs Immature Granulocytes: 0.01 10*3/uL (ref 0.00–0.07)
Basophils Absolute: 0 10*3/uL (ref 0.0–0.1)
Basophils Relative: 1 %
Eosinophils Absolute: 0.1 10*3/uL (ref 0.0–0.5)
Eosinophils Relative: 2 %
HCT: 43.6 % (ref 39.0–52.0)
Hemoglobin: 14.5 g/dL (ref 13.0–17.0)
Immature Granulocytes: 0 %
Lymphocytes Relative: 40 %
Lymphs Abs: 1.9 10*3/uL (ref 0.7–4.0)
MCH: 29.5 pg (ref 26.0–34.0)
MCHC: 33.3 g/dL (ref 30.0–36.0)
MCV: 88.6 fL (ref 80.0–100.0)
Monocytes Absolute: 0.4 10*3/uL (ref 0.1–1.0)
Monocytes Relative: 8 %
Neutro Abs: 2.4 10*3/uL (ref 1.7–7.7)
Neutrophils Relative %: 49 %
Platelets: 183 10*3/uL (ref 150–400)
RBC: 4.92 MIL/uL (ref 4.22–5.81)
RDW: 14.2 % (ref 11.5–15.5)
WBC: 4.7 10*3/uL (ref 4.0–10.5)
nRBC: 0 % (ref 0.0–0.2)

## 2020-12-09 LAB — COMPREHENSIVE METABOLIC PANEL
ALT: 167 U/L — ABNORMAL HIGH (ref 0–44)
AST: 93 U/L — ABNORMAL HIGH (ref 15–41)
Albumin: 3.9 g/dL (ref 3.5–5.0)
Alkaline Phosphatase: 75 U/L (ref 38–126)
Anion gap: 8 (ref 5–15)
BUN: 14 mg/dL (ref 8–23)
CO2: 29 mmol/L (ref 22–32)
Calcium: 9.3 mg/dL (ref 8.9–10.3)
Chloride: 104 mmol/L (ref 98–111)
Creatinine, Ser: 0.91 mg/dL (ref 0.61–1.24)
GFR, Estimated: >60 mL/min (ref >60)
Glucose, Bld: 87 mg/dL (ref 70–99)
Potassium: 3.8 mmol/L (ref 3.5–5.1)
Sodium: 141 mmol/L (ref 135–145)
Total Bilirubin: 0.9 mg/dL (ref 0.3–1.2)
Total Protein: 8 g/dL (ref 6.5–8.1)

## 2020-12-09 LAB — LIPASE, BLOOD: Lipase: 30 U/L (ref 11–51)

## 2020-12-09 MED ORDER — ONDANSETRON HCL 4 MG/2ML IJ SOLN
4.0000 mg | Freq: Once | INTRAMUSCULAR | Status: AC
  Administered 2020-12-09: 13:00:00 4 mg via INTRAVENOUS
  Filled 2020-12-09: qty 2

## 2020-12-09 MED ORDER — MORPHINE SULFATE (PF) 4 MG/ML IV SOLN
4.0000 mg | Freq: Once | INTRAVENOUS | Status: AC
  Administered 2020-12-09: 13:00:00 4 mg via INTRAVENOUS
  Filled 2020-12-09: qty 1

## 2020-12-09 MED ORDER — IOHEXOL 300 MG/ML  SOLN
100.0000 mL | Freq: Once | INTRAMUSCULAR | Status: AC | PRN
  Administered 2020-12-09: 14:00:00 100 mL via INTRAVENOUS

## 2020-12-09 MED ORDER — SODIUM CHLORIDE 0.9 % IV BOLUS
1000.0000 mL | Freq: Once | INTRAVENOUS | Status: AC
  Administered 2020-12-09: 13:00:00 1000 mL via INTRAVENOUS

## 2020-12-09 MED ORDER — HYDROCODONE-ACETAMINOPHEN 5-325 MG PO TABS: 1.0000 | ORAL_TABLET | ORAL | 0 refills | Status: DC | PRN

## 2020-12-09 NOTE — ED Triage Notes (Signed)
Arrives via EMS from home with abdominal pain and tenderness, intermittent, hx of cancer. Swelling of left groin as well.

## 2020-12-09 NOTE — Discharge Instructions (Signed)
Keep your appointment for your biopsy  Return for any new or worsening symptoms.

## 2020-12-09 NOTE — ED Provider Notes (Signed)
[facility]-EMERGENCY DEPT Provider Note   CSN: [id number] Arrival date & time: 12/09/20  1102    History Chief Complaint  Patient presents with  . Abdominal Pain    Devin Myers is a 64 y.o. male with past medical history significant for COPD, depression, HTN, recent CA diagnosis who presents for evaluation of abdominal pain.  Patient states he feels like he gets intermittent swelling to his bilateral inguinal area.  States this happens "randomly."  States he will feel a bulge to this area gets severe pain.  Patient states it resolves.  Patient states since this occurred he has had persistent right lower quadrant pain.  Patient states he was recently told he had cancer.  States he has been upset about this however denies any SI, HI, AVH.  Has biopsy scheduled for 4 days from today.  He denies any fever, chills, nausea vomiting, headache, lightness, dizziness, chest pain, shortness of breath, dysuria, diarrhea, constipation.  Denies additional aggravating or alleviating factors. Rates his current pain 7/10.  History obtained from patient and past medical records.  No interpreter used.  HPI     Past Medical History:  Diagnosis Date  . COPD (chronic obstructive pulmonary disease) (HCC)   . Depression   . Hypertension     Patient Active Problem List   Diagnosis Date Noted  . Chest pain 03/12/2019  . Shortness of breath   . Depression   . Anxiety   . 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 No pertinent surgical history.     Family History  Problem Relation Age of Onset  . Inflammatory bowel disease Daughter   . Colon cancer Neg Hx   . Esophageal cancer Neg Hx   . Pancreatic cancer Neg Hx   . Stomach cancer Neg Hx   . Liver disease Neg Hx     Social History   Tobacco Use  . Smoking status: Current Some Day Smoker    Types: Cigarettes  . Smokeless tobacco: Never Used  Vaping Use  . Vaping Use: Never used   Substance Use Topics  . Alcohol use: Yes    Comment: occasionally  . Drug use: Yes    Types: Marijuana    Home Medications Prior to Admission medications   Medication Sig Start Date End Date Taking? Authorizing Provider  albuterol (VENTOLIN HFA) 108 (90 Base) MCG/ACT inhaler Inhale 2 puffs into the lungs every 6 (six) hours as needed for wheezing or shortness of breath. 08/12/19   Anders Simmonds, PA-C  atorvastatin (LIPITOR) 20 MG tablet Take 1 tablet (20 mg total) by mouth daily at 6 PM. Patient not taking: Reported on 12/06/2020 08/12/19   Anders Simmonds, PA-C  DULoxetine (CYMBALTA) 60 MG capsule Take 1 capsule (60 mg total) by mouth daily. Patient not taking: No sig reported 08/12/19   Anders Simmonds, PA-C  HYDROcodone-acetaminophen (NORCO/VICODIN) 5-325 MG tablet Take 1 tablet by mouth every 4 (four) hours as needed. 12/09/20   Sayge Salvato A, PA-C  lisinopril (ZESTRIL) 20 MG tablet Take 1 tablet (20 mg total) by mouth daily. Patient not taking: Reported on 12/06/2020 08/12/19   Anders Simmonds, PA-C  ondansetron (ZOFRAN) 4 MG tablet Take 1 tablet (4 mg total) by mouth every 6 (six) hours as needed for nausea. Patient not taking: Reported on 12/06/2020 11/29/20   Dione Booze, MD  oxyCODONE (ROXICODONE) 5 MG immediate release tablet Take 1 tablet (5 mg total) by mouth every  6 (six) hours as needed for up to 10 days for severe pain. 12/06/20 12/16/20  Doran Stabler, MD  Spacer/Aero-Holding Chambers (AEROCHAMBER PLUS WITH MASK) inhaler Use with inhaler Patient not taking: Reported on 12/06/2020 08/12/19   Argentina Donovan, PA-C  SYMBICORT 80-4.5 MCG/ACT inhaler Inhale 2 puffs into the lungs in the morning and at bedtime. 09/09/20   [provider]  tamsulosin (FLOMAX) 0.4 MG CAPS capsule Take 1 capsule (0.4 mg total) by mouth daily. Patient not taking: Reported on 12/06/2020 05/15/19   Noemi Chapel, MD  umeclidinium bromide (INCRUSE ELLIPTA) 62.5 MCG/INH AEPB  Inhale 1 puff into the lungs daily. Patient not taking: No sig reported 08/12/19   Argentina Donovan, PA-C    Allergies    Codeine  Review of Systems   Review of Systems  Constitutional: Negative.   HENT: Negative.   Respiratory: Negative.   Cardiovascular: Negative.   Gastrointestinal: Positive for abdominal pain. Negative for abdominal distention, anal bleeding, blood in stool, constipation, diarrhea, nausea, rectal pain and vomiting.  Genitourinary: Negative.   Musculoskeletal: Negative.   Skin: Negative.   Neurological: Negative.   All other systems reviewed and are negative.   Physical Exam Updated Vital Signs BP (!) 149/113 (BP Location: Left Arm)   Pulse 64   Temp (!) 97.5 F (36.4 C) (Oral)   Resp 17   Ht 5\' 11"  (1.803 m)   Wt 67 kg   SpO2 99%   BMI 20.60 kg/m   Physical Exam Vitals and nursing note reviewed.  Constitutional:      General: He is not in acute distress.    Appearance: He is well-developed and well-nourished. He is not ill-appearing, toxic-appearing or diaphoretic.  HENT:     Head: Normocephalic and atraumatic.  Eyes:     Pupils: Pupils are equal, round, and reactive to light.  Cardiovascular:     Rate and Rhythm: Normal rate and regular rhythm.     Heart sounds: Normal heart sounds.  Pulmonary:     Effort: Pulmonary effort is normal. No respiratory distress.     Breath sounds: Normal breath sounds.     Comments: Speaks in full sentences without difficulty.  Clear to auscultation. Abdominal:     General: Bowel sounds are normal. There is no distension.     Palpations: Abdomen is soft.     Tenderness: There is abdominal tenderness in the right lower quadrant, suprapubic area and left lower quadrant. There is no right CVA tenderness, left CVA tenderness, guarding or rebound. Negative signs include Murphy's sign and McBurney's sign.     Hernia: No hernia is present.     Comments: Generalized tenderness to lower abdomen.  No rebound or guarding.   No obvious hernia.  Genitourinary:    Penis: Normal.      Testes: Normal.     Comments: No gross inguinal hernia. Musculoskeletal:        General: Normal range of motion.     Cervical back: Normal range of motion and neck supple.  Skin:    General: Skin is warm and dry.     Capillary Refill: Capillary refill takes less than 2 seconds.  Neurological:     General: No focal deficit present.     Mental Status: He is alert and oriented to person, place, and time.  Psychiatric:        Mood and Affect: Mood and affect normal.    ED Results / Procedures / Treatments   Labs (  all labs ordered are listed, but only abnormal results are displayed) Labs Reviewed  COMPREHENSIVE METABOLIC PANEL - Abnormal; Notable for the following components:      Result Value   AST 93 (*)    ALT 167 (*)    All other components within normal limits  CBC WITH DIFFERENTIAL/PLATELET  LIPASE, BLOOD  URINALYSIS, ROUTINE W REFLEX MICROSCOPIC    EKG None  Radiology CT Abdomen Pelvis W Contrast  Result Date: 12/09/2020 CLINICAL DATA:  Right lower quadrant pain this morning, 10-15 pounds weight loss, hepatocellular carcinoma EXAM: CT ABDOMEN AND PELVIS WITH CONTRAST TECHNIQUE: Multidetector CT imaging of the abdomen and pelvis was performed using the standard protocol following bolus administration of intravenous contrast. CONTRAST:  17mL OMNIPAQUE IOHEXOL 300 MG/ML  SOLN COMPARISON:  11/11/2020 FINDINGS: Examination is generally somewhat limited by patient arm positioning over the central abdomen with metallic streak artifact related to rings and jewelry. Lower chest: No acute abnormality. Hepatobiliary: Redemonstrated, multilobulated heterogeneously enhancing mass replacing essentially the entire left lobe of the liver, measuring approximately 12.7 x 7.0 cm in axial dimension, not significantly changed compared to prior CT (series 2, image 19). No gallstones, gallbladder wall thickening, or biliary dilatation.  Pancreas: Unremarkable. No pancreatic ductal dilatation or surrounding inflammatory changes. Spleen: Normal in size without significant abnormality. Adrenals/Urinary Tract: Adrenal glands are unremarkable. Kidneys are normal, without renal calculi, solid lesion, or hydronephrosis. Bladder is unremarkable. Stomach/Bowel: Stomach is within normal limits. Appendix appears normal. No evidence of bowel wall thickening, distention, or inflammatory changes. Vascular/Lymphatic: Aortic atherosclerosis. Redemonstrated enlarged portacaval and celiac axis lymph nodes measuring up to 1.7 x 1.0 cm (series 2, image 19). Reproductive: No mass or other significant abnormality. Other: No abdominal wall hernia or abnormality. No abdominopelvic ascites. Musculoskeletal: No acute or significant osseous findings. IMPRESSION: 1. Examination is generally somewhat limited by patient arm positioning over the central abdomen with metallic streak artifact related to rings and jewelry. 2. Within this limitation, no acute CT findings of the abdomen or pelvis to explain right lower quadrant abdominal pain. Normal appendix. 3. Redemonstrated, multilobulated heterogeneously enhancing mass replacing essentially the entire left lobe of the liver, measuring approximately 12.7 x 7.0 cm in axial dimension, not significantly changed compared to prior CT and consistent with hepatocellular carcinoma. 4. Redemonstrated enlarged portacaval and celiac axis lymph nodes measuring up to 1.7 x 1.0 cm. Aortic Atherosclerosis (ICD10-I70.0). Electronically Signed   By: Eddie Candle M.D.   On: 12/09/2020 14:42    Procedures Procedures (including critical care time)  Medications Ordered in ED Medications  sodium chloride 0.9 % bolus 1,000 mL (0 mLs Intravenous Stopped 12/09/20 1546)  ondansetron (ZOFRAN) injection 4 mg (4 mg Intravenous Given 12/09/20 1247)  morphine 4 MG/ML injection 4 mg (4 mg Intravenous Given 12/09/20 1247)  iohexol (OMNIPAQUE) 300  MG/ML solution 100 mL (100 mLs Intravenous Contrast Given 12/09/20 1418)   ED Course  I have reviewed the triage vital signs and the nursing notes.  Pertinent labs & imaging results that were available during my care of the patient were reviewed by me and considered in my medical decision making (see chart for details).  64 year old presents for evaluation of diffuse lower abdominal pain.  Intermittent in nature.  States he feels like there is a "bulge."  Unfortunately he was recently diagnosed with cancer.  Has biopsy scheduled for 4 days from today.  Has been upset since diagnosis however denies any SI, HI, AVH.  Heart and lungs are clear.  His abdomen  is soft.  Does have diffuse tenderness however no rebound or guarding.  No obvious hernias on exam.  Plan on pain control, CT imaging, reassess  Labs and imaging personally reviewed and interpreted:  CBC without leukocytosis, hemoglobin stable CMP metabolic panel with mild elevation in AST, ALT however this is similar to his prior visits.  No additional electrolyte, renal or liver abnormality Lipase 30 UA negative for infection CT AP without acute changes. Similar likely hepatocellular carcinoma  Patient reassessed. Pain improved.  Awaiting CT scan.  Patient reassessed.  He has removed his IV, his leads and distressed.  He is standing in hallway.  He states he is ready to go home.  He is requesting a short course of pain medicine until he can follow back up in 4 days for his biopsy.  He is amenable to this.  His compartments are soft.  He has no unilateral leg swelling, redness or warmth.  I have low suspicion for acute vascular occlusion etiology as cause of his pain. Pain is likely due to his known cancer.  No acute findings on his CT.  He is tolerating p.o. intake without difficulty.  Patient is nontoxic, nonseptic appearing, in no apparent distress.  Patient's pain and other symptoms adequately managed in emergency department.  Fluid bolus  given.  Labs, imaging and vitals reviewed.  Patient does not meet the SIRS or Sepsis criteria.  On repeat exam patient does not have a surgical abdomin and there are no peritoneal signs.  No indication of appendicitis, bowel obstruction, bowel perforation, cholecystitis, diverticulitis.  The patient has been appropriately medically screened and/or stabilized in the ED. I have low suspicion for any other emergent medical condition which would require further screening, evaluation or treatment in the ED or require inpatient management.  Patient is hemodynamically stable and in no acute distress.  Patient able to ambulate in department prior to ED.  Evaluation does not show acute pathology that would require ongoing or additional emergent interventions while in the emergency department or further inpatient treatment.  I have discussed the diagnosis with the patient and answered all questions.  Pain is been managed while in the emergency department and patient has no further complaints prior to discharge.  Patient is comfortable with plan discussed in room and is stable for discharge at this time.  I have discussed strict return precautions for returning to the emergency department.  Patient was encouraged to follow-up with PCP/specialist refer to at discharge.    MDM Rules/Calculators/A&P                           Final Clinical Impression(s) / ED Diagnoses Final diagnoses:  Right lower quadrant abdominal pain    Rx / DC Orders ED Discharge Orders         Ordered    HYDROcodone-acetaminophen (NORCO/VICODIN) 5-325 MG tablet  Every 4 hours PRN        12/09/20 1551           Skylen Danielsen A, PA-C 12/09/20 1554    Drenda Freeze, MD 12/09/20 2218

## 2020-12-12 ENCOUNTER — Other Ambulatory Visit: Payer: Self-pay | Admitting: Radiology

## 2020-12-13 ENCOUNTER — Other Ambulatory Visit: Payer: Self-pay

## 2020-12-13 ENCOUNTER — Encounter (HOSPITAL_COMMUNITY): Payer: Self-pay

## 2020-12-13 ENCOUNTER — Ambulatory Visit (HOSPITAL_COMMUNITY)
Admission: RE | Admit: 2020-12-13 | Discharge: 2020-12-13 | Disposition: A | Discharge status: Home / Self Care | Payer: Medicaid Other | Source: Ambulatory Visit | Attending: Gastroenterology | Admitting: Gastroenterology

## 2020-12-13 DIAGNOSIS — R160 Hepatomegaly, not elsewhere classified: Secondary | ICD-10-CM | POA: Diagnosis present

## 2020-12-13 DIAGNOSIS — C220 Liver cell carcinoma: Secondary | ICD-10-CM | POA: Insufficient documentation

## 2020-12-13 DIAGNOSIS — I10 Essential (primary) hypertension: Secondary | ICD-10-CM | POA: Diagnosis not present

## 2020-12-13 DIAGNOSIS — F17210 Nicotine dependence, cigarettes, uncomplicated: Secondary | ICD-10-CM | POA: Insufficient documentation

## 2020-12-13 DIAGNOSIS — J449 Chronic obstructive pulmonary disease, unspecified: Secondary | ICD-10-CM | POA: Diagnosis not present

## 2020-12-13 DIAGNOSIS — Z7951 Long term (current) use of inhaled steroids: Secondary | ICD-10-CM | POA: Insufficient documentation

## 2020-12-13 DIAGNOSIS — Z79899 Other long term (current) drug therapy: Secondary | ICD-10-CM | POA: Diagnosis not present

## 2020-12-13 LAB — CBC
HCT: 43.9 % (ref 39.0–52.0)
Hemoglobin: 14.6 g/dL (ref 13.0–17.0)
MCH: 29.4 pg (ref 26.0–34.0)
MCHC: 33.3 g/dL (ref 30.0–36.0)
MCV: 88.5 fL (ref 80.0–100.0)
Platelets: 146 10*3/uL — ABNORMAL LOW (ref 150–400)
RBC: 4.96 MIL/uL (ref 4.22–5.81)
RDW: 14.4 % (ref 11.5–15.5)
WBC: 4.7 10*3/uL (ref 4.0–10.5)
nRBC: 0 % (ref 0.0–0.2)

## 2020-12-13 LAB — PROTIME-INR
INR: 1.1 (ref 0.8–1.2)
Prothrombin Time: 13.9 seconds (ref 11.4–15.2)

## 2020-12-13 MED ORDER — LIDOCAINE HCL 1 % IJ SOLN
INTRAMUSCULAR | Status: AC
  Filled 2020-12-13: qty 20

## 2020-12-13 MED ORDER — MIDAZOLAM HCL 2 MG/2ML IJ SOLN
INTRAMUSCULAR | Status: AC | PRN
Start: 2020-12-13 — End: 2020-12-13
  Administered 2020-12-13 (×2): 1 mg via INTRAVENOUS

## 2020-12-13 MED ORDER — FENTANYL CITRATE (PF) 100 MCG/2ML IJ SOLN
INTRAMUSCULAR | Status: AC
  Filled 2020-12-13: qty 2

## 2020-12-13 MED ORDER — MIDAZOLAM HCL 2 MG/2ML IJ SOLN
INTRAMUSCULAR | Status: AC
  Filled 2020-12-13: qty 4

## 2020-12-13 MED ORDER — FENTANYL CITRATE (PF) 100 MCG/2ML IJ SOLN
INTRAMUSCULAR | Status: AC | PRN
  Administered 2020-12-13 (×2): 50 ug via INTRAVENOUS

## 2020-12-13 MED ORDER — SODIUM CHLORIDE 0.9 % IV SOLN: INTRAVENOUS | Status: DC

## 2020-12-13 MED ORDER — GELATIN ABSORBABLE 12-7 MM EX MISC
CUTANEOUS | Status: AC
  Filled 2020-12-13: qty 1

## 2020-12-13 NOTE — Discharge Instructions (Signed)
Liver Biopsy, Care After These instructions give you information on caring for yourself after your procedure. Your doctor may also give you more specific instructions. Call your doctor if you have any problems or questions after your procedure. What can I expect after the procedure? After the procedure, it is common to have:  Pain and soreness where the biopsy was done.  Bruising around the area where the biopsy was done.  Sleepiness and be tired for a few days. Follow these instructions at home: Medicines  Take over-the-counter and prescription medicines only as told by your doctor.  If you were prescribed an antibiotic medicine, take it as told by your doctor. Do not stop taking the antibiotic even if you start to feel better.  Do not take medicines such as aspirin and ibuprofen. These medicines can thin your blood. Do not take these medicines unless your doctor tells you to take them.  If you are taking prescription pain medicine, take actions to prevent or treat constipation. Your doctor may recommend that you: ? Drink enough fluid to keep your pee (urine) clear or pale yellow. ? Take over-the-counter or prescription medicines. ? Eat foods that are high in fiber, such as fresh fruits and vegetables, whole grains, and beans. ? Limit foods that are high in fat and processed sugars, such as fried and sweet foods. Caring for your cut  Follow instructions from your doctor about how to take care of your cuts from surgery (incisions). Make sure you: ? Wash your hands with soap and water before you change your bandage (dressing). If you cannot use soap and water, use hand sanitizer. ? Change your bandage as told by your doctor. ? Leave stitches (sutures), skin glue, or skin tape (adhesive) strips in place. They may need to stay in place for 2 weeks or longer. If tape strips get loose and curl up, you may trim the loose edges. Do not remove tape strips completely unless your doctor says it is  okay.  Check your cuts every day for signs of infection. Check for: ? Redness, swelling, or more pain. ? Fluid or blood. ? Pus or a bad smell. ? Warmth.  Do not take baths, swim, or use a hot tub until your doctor says it is okay to do so. Activity   Rest at home for 1-2 days or as told by your doctor. ? Avoid sitting for a long time without moving. Get up to take short walks every 1-2 hours.  Return to your normal activities as told by your doctor. Ask what activities are safe for you.  Do not do these things in the first 24 hours: ? Drive. ? Use machinery. ? Take a bath or shower.  Do not lift more than 10 pounds (4.5 kg) or play contact sports for the first 2 weeks. General instructions   Do not drink alcohol in the first week after the procedure.  Have someone stay with you for at least 24 hours after the procedure.  Get your test results. Ask your doctor or the department that is doing the test: ? When will my results be ready? ? How will I get my results? ? What are my treatment options? ? What other tests do I need? ? What are my next steps?  Keep all follow-up visits as told by your doctor. This is important. Contact a doctor if:  A cut bleeds and leaves more than just a small spot of blood.  A cut is red, puffs up (  swells), or hurts more than before.  Fluid or something else comes from a cut.  A cut smells bad.  You have a fever or chills. Get help right away if:  You have swelling, bloating, or pain in your belly (abdomen).  You get dizzy or faint.  You have a rash.  You feel sick to your stomach (nauseous) or throw up (vomit).  You have trouble breathing, feel short of breath, or feel faint.  Your chest hurts.  You have problems talking or seeing.  You have trouble with your balance or moving your arms or legs. Summary  After the procedure, it is common to have pain, soreness, bruising, and tiredness.  Your doctor will tell you how to  take care of yourself at home. Change your bandage, take your medicines, and limit your activities as told by your doctor.  Call your doctor if you have symptoms of infection. Get help right away if your belly swells, your cut bleeds a lot, or you have trouble talking or breathing. This information is not intended to replace advice given to you by your health care provider. Make sure you discuss any questions you have with your health care provider. Document Revised: 12/13/2017 Document Reviewed: 12/13/2017 Elsevier Patient Education  2020 [employer] Moderate Conscious Sedation, Adult Sedation is the use of medicines to promote relaxation and relieve discomfort and anxiety. Moderate conscious sedation is a type of sedation. Under moderate conscious sedation, you are less alert than normal, but you are still able to respond to instructions, touch, or both. Moderate conscious sedation is used during short medical and dental procedures. It is milder than deep sedation, which is a type of sedation under which you cannot be easily woken up. It is also milder than general anesthesia, which is the use of medicines to make you unconscious. Moderate conscious sedation allows you to return to your regular activities sooner. Tell a health care provider about:  Any allergies you have.  All medicines you are taking, including vitamins, herbs, eye drops, creams, and over-the-counter medicines.  Use of steroids (by mouth or creams).  Any problems you or family members have had with sedatives and anesthetic medicines.  Any blood disorders you have.  Any surgeries you have had.  Any medical conditions you have, such as sleep apnea.  Whether you are pregnant or may be pregnant.  Any use of cigarettes, alcohol, marijuana, or street drugs. What are the risks? Generally, this is a safe procedure. However, problems may occur, including:  Getting too much medicine (oversedation).  Nausea.  Allergic  reaction to medicines.  Trouble breathing. If this happens, a breathing tube may be used to help with breathing. It will be removed when you are awake and breathing on your own.  Heart trouble.  Lung trouble. What happens before the procedure? Staying hydrated Follow instructions from your health care provider about hydration, which may include:  Up to 2 hours before the procedure - you may continue to drink clear liquids, such as water, clear fruit juice, black coffee, and plain tea. Eating and drinking restrictions Follow instructions from your health care provider about eating and drinking, which may include:  8 hours before the procedure - stop eating heavy meals or foods such as meat, fried foods, or fatty foods.  6 hours before the procedure - stop eating light meals or foods, such as toast or cereal.  6 hours before the procedure - stop drinking milk or drinks that contain milk.  2   hours before the procedure - stop drinking clear liquids. Medicine Ask your health care provider about:  Changing or stopping your regular medicines. This is especially important if you are taking diabetes medicines or blood thinners.  Taking medicines such as aspirin and ibuprofen. These medicines can thin your blood. Do not take these medicines before your procedure if your health care provider instructs you not to.  Tests and exams  You will have a physical exam.  You may have blood tests done to show: ? How well your kidneys and liver are working. ? How well your blood can clot. General instructions  Plan to have someone take you home from the hospital or clinic.  If you will be going home right after the procedure, plan to have someone with you for 24 hours. What happens during the procedure?  An IV tube will be inserted into one of your veins.  Medicine to help you relax (sedative) will be given through the IV tube.  The medical or dental procedure will be performed. What  happens after the procedure?  Your blood pressure, heart rate, breathing rate, and blood oxygen level will be monitored often until the medicines you were given have worn off.  Do not drive for 24 hours. This information is not intended to replace advice given to you by your health care provider. Make sure you discuss any questions you have with your health care provider. Document Revised: 11/15/2017 Document Reviewed: 03/24/2016 Elsevier Patient Education  [employer].    Interventional Radiologist- [phone]- call with any problems

## 2020-12-13 NOTE — H&P (Signed)
Chief Complaint: Liver mass. Request is for liver biopsy  Referring Physician(s): Dr. Ellwood Dense  Supervising Physician: Irish Lack  Patient Status: [facility] - Out-pt  History of Present Illness: Devin Myers is a 64 y.o. male History of COPD, HTN.  Patient has a known liver mass in the left lobe of the liver found to have a significant progression seen while being worked up for RLQ abdominal pain. CT Abdomen from 12.24.21 reads Redemonstrated, multilobulated heterogeneously enhancing mass replacing essentially the entire left lobe of the liver, measuring approximately 12.7 x 7.0 cm in axial dimension, not significantly changed compared to prior CT and consistent with hepatocellular carcinoma.  Team is requesting a liver biopsy for further evaluation.    Past Medical History:  Diagnosis Date  . COPD (chronic obstructive pulmonary disease) (HCC)   . Depression   . Hypertension     No past surgical history on file.  Allergies: Codeine  Medications: Prior to Admission medications   Medication Sig Start Date End Date Taking? Authorizing Provider  albuterol (VENTOLIN HFA) 108 (90 Base) MCG/ACT inhaler Inhale 2 puffs into the lungs every 6 (six) hours as needed for wheezing or shortness of breath. 08/12/19   Anders Simmonds, PA-C  atorvastatin (LIPITOR) 20 MG tablet Take 1 tablet (20 mg total) by mouth daily at 6 PM. Patient not taking: Reported on 12/06/2020 08/12/19   Anders Simmonds, PA-C  DULoxetine (CYMBALTA) 60 MG capsule Take 1 capsule (60 mg total) by mouth daily. Patient not taking: No sig reported 08/12/19   Anders Simmonds, PA-C  HYDROcodone-acetaminophen (NORCO/VICODIN) 5-325 MG tablet Take 1 tablet by mouth every 4 (four) hours as needed. 12/09/20   Henderly, Britni A, PA-C  lisinopril (ZESTRIL) 20 MG tablet Take 1 tablet (20 mg total) by mouth daily. Patient not taking: Reported on 12/06/2020 08/12/19   Anders Simmonds, PA-C  ondansetron (ZOFRAN) 4 MG tablet Take  1 tablet (4 mg total) by mouth every 6 (six) hours as needed for nausea. Patient not taking: Reported on 12/06/2020 11/29/20   Dione Booze, MD  oxyCODONE (ROXICODONE) 5 MG immediate release tablet Take 1 tablet (5 mg total) by mouth every 6 (six) hours as needed for up to 10 days for severe pain. 12/06/20 12/16/20  Sherrilyn Rist, MD  Spacer/Aero-Holding Chambers (AEROCHAMBER PLUS WITH MASK) inhaler Use with inhaler Patient not taking: Reported on 12/06/2020 08/12/19   Anders Simmonds, PA-C  SYMBICORT 80-4.5 MCG/ACT inhaler Inhale 2 puffs into the lungs in the morning and at bedtime. 09/09/20   [provider]  tamsulosin (FLOMAX) 0.4 MG CAPS capsule Take 1 capsule (0.4 mg total) by mouth daily. Patient not taking: Reported on 12/06/2020 05/15/19   Eber Hong, MD  umeclidinium bromide (INCRUSE ELLIPTA) 62.5 MCG/INH AEPB Inhale 1 puff into the lungs daily. Patient not taking: No sig reported 08/12/19   Anders Simmonds, PA-C     Family History  Problem Relation Age of Onset  . Inflammatory bowel disease Daughter   . Colon cancer Neg Hx   . Esophageal cancer Neg Hx   . Pancreatic cancer Neg Hx   . Stomach cancer Neg Hx   . Liver disease Neg Hx     Social History   Socioeconomic History  . Marital status: Divorced    Spouse name: Not on file  . Number of children: Not on file  . Years of education: Not on file  . Highest education level: Not on file  Occupational  History  . Not on file  Tobacco Use  . Smoking status: Current Some Day Smoker    Types: Cigarettes  . Smokeless tobacco: Never Used  Vaping Use  . Vaping Use: Never used  Substance and Sexual Activity  . Alcohol use: Yes    Comment: occasionally  . Drug use: Yes    Types: Marijuana  . Sexual activity: Not Currently  Other Topics Concern  . Not on file  Social History Narrative  . Not on file   Social Determinants of Health   Financial Resource Strain: Not on file  Food Insecurity: Not on file   Transportation Needs: Not on file  Physical Activity: Not on file  Stress: Not on file  Social Connections: Not on file     Review of Systems: A 12 point ROS discussed and pertinent positives are indicated in the HPI above.  All other systems are negative.  Review of Systems  Constitutional: Negative for fever.  HENT: Negative for congestion.   Respiratory: Negative for cough and shortness of breath.   Cardiovascular: Negative for chest pain.  Gastrointestinal: Positive for abdominal pain ( intermittent and transient in nature. ).  Neurological: Negative for headaches.  Psychiatric/Behavioral: Negative for behavioral problems and confusion.    Vital Signs: There were no vitals taken for this visit.  Physical Exam Vitals and nursing note reviewed.  Constitutional:      Appearance: He is well-developed and well-nourished.  HENT:     Head: Normocephalic.  Cardiovascular:     Rate and Rhythm: Normal rate and regular rhythm.     Heart sounds: Normal heart sounds.  Pulmonary:     Effort: Pulmonary effort is normal.     Breath sounds: Normal breath sounds.  Musculoskeletal:        General: Normal range of motion.     Cervical back: Normal range of motion.  Skin:    General: Skin is dry.  Neurological:     Mental Status: He is alert and oriented to person, place, and time.  Psychiatric:        Mood and Affect: Mood and affect normal.     Imaging: CT Abdomen Pelvis W Contrast  Result Date: 12/09/2020 CLINICAL DATA:  Right lower quadrant pain this morning, 10-15 pounds weight loss, hepatocellular carcinoma EXAM: CT ABDOMEN AND PELVIS WITH CONTRAST TECHNIQUE: Multidetector CT imaging of the abdomen and pelvis was performed using the standard protocol following bolus administration of intravenous contrast. CONTRAST:  173mL OMNIPAQUE IOHEXOL 300 MG/ML  SOLN COMPARISON:  11/11/2020 FINDINGS: Examination is generally somewhat limited by patient arm positioning over the central  abdomen with metallic streak artifact related to rings and jewelry. Lower chest: No acute abnormality. Hepatobiliary: Redemonstrated, multilobulated heterogeneously enhancing mass replacing essentially the entire left lobe of the liver, measuring approximately 12.7 x 7.0 cm in axial dimension, not significantly changed compared to prior CT (series 2, image 19). No gallstones, gallbladder wall thickening, or biliary dilatation. Pancreas: Unremarkable. No pancreatic ductal dilatation or surrounding inflammatory changes. Spleen: Normal in size without significant abnormality. Adrenals/Urinary Tract: Adrenal glands are unremarkable. Kidneys are normal, without renal calculi, solid lesion, or hydronephrosis. Bladder is unremarkable. Stomach/Bowel: Stomach is within normal limits. Appendix appears normal. No evidence of bowel wall thickening, distention, or inflammatory changes. Vascular/Lymphatic: Aortic atherosclerosis. Redemonstrated enlarged portacaval and celiac axis lymph nodes measuring up to 1.7 x 1.0 cm (series 2, image 19). Reproductive: No mass or other significant abnormality. Other: No abdominal wall hernia or abnormality. No abdominopelvic  ascites. Musculoskeletal: No acute or significant osseous findings. IMPRESSION: 1. Examination is generally somewhat limited by patient arm positioning over the central abdomen with metallic streak artifact related to rings and jewelry. 2. Within this limitation, no acute CT findings of the abdomen or pelvis to explain right lower quadrant abdominal pain. Normal appendix. 3. Redemonstrated, multilobulated heterogeneously enhancing mass replacing essentially the entire left lobe of the liver, measuring approximately 12.7 x 7.0 cm in axial dimension, not significantly changed compared to prior CT and consistent with hepatocellular carcinoma. 4. Redemonstrated enlarged portacaval and celiac axis lymph nodes measuring up to 1.7 x 1.0 cm. Aortic Atherosclerosis (ICD10-I70.0).  Electronically Signed   By: Eddie Candle M.D.   On: 12/09/2020 14:42    Labs:  CBC: Recent Labs    11/11/20 1130 11/29/20 1546 11/30/20 1751 12/09/20 1227  WBC 4.9 5.7 4.7 4.7  HGB 13.8 13.9 14.0 14.5  HCT 42.1 43.4 40.6 43.6  PLT 217 206 222 183    COAGS: Recent Labs    12/06/20 1528  INR 1.1*    BMP: Recent Labs    11/11/20 1130 11/29/20 1546 12/09/20 1227  NA 141 141 141  K 3.7 3.9 3.8  CL 105 107 104  CO2 25 26 29   GLUCOSE 116* 105* 87  BUN 7* 15 14  CALCIUM 9.3 9.0 9.3  CREATININE 1.01 0.85 0.91  GFRNONAA >60 >60 >60    LIVER FUNCTION TESTS: Recent Labs    11/11/20 1130 11/29/20 1546 11/30/20 1751 12/09/20 1227  BILITOT 0.9 0.7 0.9 0.9  AST 82* 79* 78* 93*  ALT 132* 127* 138* 167*  ALKPHOS 80 89 83 75  PROT 7.8 7.9 8.1 8.0  ALBUMIN 3.6 3.9 3.6 3.9     Assessment and Plan:  64 y.o. male outpatient History of COPD, HTN.  Patient has a known liver mass in the left lobe of the liver found to have a significant progression seen while being worked up for RLQ abdominal pain. CT Abdomen from 12.24.21 reads Redemonstrated, multilobulated heterogeneously enhancing mass replacing essentially the entire left lobe of the liver, measuring approximately 12.7 x 7.0 cm in axial dimension, not significantly changed compared to prior CT and consistent with hepatocellular carcinoma.  Team is requesting a liver biopsy for further evaluation. Mr Hinh is scheduled to follow up with Dr. Burr Medico on 1.3.22  All labs (from 12.24.21) and medications are within acceptable parameters. Pertinent Allergies include codeine. Patient has been NPO since midnight.  Risks and benefits of liver biopsy was discussed with the patient and/or patient's family including, but not limited to bleeding, infection, damage to adjacent structures or low yield requiring additional tests.  All of the questions were answered and there is agreement to proceed.  Consent signed and in chart.  Thank  you for this interesting consult.  I greatly enjoyed meeting Lion Schrantz and look forward to participating in their care.  A copy of this report was sent to the requesting provider on this date.  Electronically Signed: Jacqualine Mau, NP 12/13/2020, 11:49 AM   I spent a total of  30 Minutes   in face to face in clinical consultation, greater than 50% of which was counseling/coordinating care for liver biopsy

## 2020-12-13 NOTE — Procedures (Signed)
Interventional Radiology Procedure Note  Procedure: US Guided Biopsy of liver mass  Complications: None  Estimated Blood Loss: < 10 mL  Findings: 18 G core biopsy of left lobe liver mass performed under US guidance.  Three core samples obtained and sent to Pathology.  Gini Caputo T. Tuck Dulworth, M.D Pager:  [phone]    

## 2020-12-14 LAB — SURGICAL PATHOLOGY

## 2020-12-15 ENCOUNTER — Encounter (HOSPITAL_COMMUNITY): Payer: Self-pay | Admitting: Emergency Medicine

## 2020-12-15 ENCOUNTER — Other Ambulatory Visit: Payer: Self-pay

## 2020-12-15 ENCOUNTER — Emergency Department (HOSPITAL_COMMUNITY)
Admission: EM | Admit: 2020-12-15 | Discharge: 2020-12-15 | Disposition: A | Discharge status: Home / Self Care | Payer: Medicaid Other | Attending: Emergency Medicine | Admitting: Emergency Medicine

## 2020-12-15 ENCOUNTER — Emergency Department (HOSPITAL_COMMUNITY): Payer: Medicaid Other

## 2020-12-15 DIAGNOSIS — R1032 Left lower quadrant pain: Secondary | ICD-10-CM

## 2020-12-15 DIAGNOSIS — F17210 Nicotine dependence, cigarettes, uncomplicated: Secondary | ICD-10-CM | POA: Diagnosis not present

## 2020-12-15 DIAGNOSIS — I10 Essential (primary) hypertension: Secondary | ICD-10-CM | POA: Insufficient documentation

## 2020-12-15 DIAGNOSIS — J449 Chronic obstructive pulmonary disease, unspecified: Secondary | ICD-10-CM | POA: Insufficient documentation

## 2020-12-15 DIAGNOSIS — R0789 Other chest pain: Secondary | ICD-10-CM

## 2020-12-15 DIAGNOSIS — R1012 Left upper quadrant pain: Secondary | ICD-10-CM | POA: Diagnosis not present

## 2020-12-15 DIAGNOSIS — Z8505 Personal history of malignant neoplasm of liver: Secondary | ICD-10-CM | POA: Diagnosis not present

## 2020-12-15 DIAGNOSIS — Z7951 Long term (current) use of inhaled steroids: Secondary | ICD-10-CM | POA: Diagnosis not present

## 2020-12-15 LAB — CBC WITH DIFFERENTIAL/PLATELET
Abs Immature Granulocytes: 0.01 10*3/uL (ref 0.00–0.07)
Basophils Absolute: 0 10*3/uL (ref 0.0–0.1)
Basophils Relative: 1 %
Eosinophils Absolute: 0.1 10*3/uL (ref 0.0–0.5)
Eosinophils Relative: 2 %
HCT: 45.8 % (ref 39.0–52.0)
Hemoglobin: 15.6 g/dL (ref 13.0–17.0)
Immature Granulocytes: 0 %
Lymphocytes Relative: 33 %
Lymphs Abs: 2 10*3/uL (ref 0.7–4.0)
MCH: 29.7 pg (ref 26.0–34.0)
MCHC: 34.1 g/dL (ref 30.0–36.0)
MCV: 87.2 fL (ref 80.0–100.0)
Monocytes Absolute: 0.5 10*3/uL (ref 0.1–1.0)
Monocytes Relative: 9 %
Neutro Abs: 3.4 10*3/uL (ref 1.7–7.7)
Neutrophils Relative %: 55 %
Platelets: 191 10*3/uL (ref 150–400)
RBC: 5.25 MIL/uL (ref 4.22–5.81)
RDW: 14.5 % (ref 11.5–15.5)
WBC: 6.1 10*3/uL (ref 4.0–10.5)
nRBC: 0 % (ref 0.0–0.2)

## 2020-12-15 LAB — BASIC METABOLIC PANEL
Anion gap: 10 (ref 5–15)
BUN: 22 mg/dL (ref 8–23)
CO2: 26 mmol/L (ref 22–32)
Calcium: 9.9 mg/dL (ref 8.9–10.3)
Chloride: 105 mmol/L (ref 98–111)
Creatinine, Ser: 1.12 mg/dL (ref 0.61–1.24)
GFR, Estimated: >60 mL/min (ref >60)
Glucose, Bld: 106 mg/dL — ABNORMAL HIGH (ref 70–99)
Potassium: 4.1 mmol/L (ref 3.5–5.1)
Sodium: 141 mmol/L (ref 135–145)

## 2020-12-15 LAB — LIPASE, BLOOD: Lipase: 28 U/L (ref 11–51)

## 2020-12-15 LAB — TROPONIN I (HIGH SENSITIVITY): Troponin I (High Sensitivity): 4 ng/L (ref <18)

## 2020-12-15 NOTE — Discharge Instructions (Addendum)
Seen here for chest pain and abdominal pain.  Lab work and imaging all looks reassuring.  I recommend over-the-counter pain medications like ibuprofen and or Tylenol every 6 as needed please follow dosing back of bottle.    I reviewed your chart and see that you have an upcoming appointment with Dr. Parke Poisson your oncologist, on January 3, 3 PM, I recommend going to this appointment.  I have given you her contact information if you need to cancel or change the appointment.  Come back to the emergency department if you develop chest pain, shortness of breath, severe abdominal pain, uncontrolled nausea, vomiting, diarrhea.

## 2020-12-15 NOTE — ED Triage Notes (Signed)
Pt states he has a boil on his R sided groin. Seen for same recently. Alert and oriented. Ambulatory.

## 2020-12-15 NOTE — Progress Notes (Signed)
Left voice message for patient regarding upcoming appointment with Dr. Mosetta Putt on 12/19/2020 at 3 pm.  I explained he needs to arrive by 2:45 and he can bring one person with him to the appointment.  I left my direct contact number to call back if he should have any questions.

## 2020-12-15 NOTE — ED Provider Notes (Signed)
[name] Provider Note   CSN: [id number] Arrival date & time: 12/15/20  1406     History Chief Complaint  Patient presents with  . Groin Pain    Devin Myers is a 64 y.o. male.  HPI   Patient with significant medical history of COPD, depression, hypertension, liver cancer presents to the emergency department with  chief complaint of chest pain and left upper quadrant pain.  Patient states chest pain and left upper quadrant pain has been going on for  the last week, states the pain has become more persistent, describes as a sharp sensation, pain does not radiate.  He denies shortness of breath, becoming diaphoretic, exertion making the pain worse, paresthesia weakness the upper lower extremities.  He denies any alleviating or aggravating factors.  He denies fevers, chills, nausea, vomiting, diarrhea, urinary symptoms.  He has no significant abdominal history, no history of kidney stones.  He also endorses that he sometimes feels a bulge in his right groin, only happens when he walks.  He denies scrotal swelling, testicular pain, penile discharge.  Patient denies headaches, fevers, chills, shortness of breath, pedal edema.   After reviewing patient's chart he was diagnosed with probable hepatocellular carcinoma noted on CT abdomen pelvis on 12/21 by his GI doctor Dr. Kizzie Ide.  He presented to the emergency department on 12/24 for abdominal pain, imaging appears to be unchanged from prior imaging, patient was discharged home. He recently got a biopsy performed on 1228 via ultrasound and instructed follow-up with oncology for further evaluation  Past Medical History:  Diagnosis Date  . COPD (chronic obstructive pulmonary disease) ([place])   . Depression   . Hypertension     Patient Active Problem List   Diagnosis Date Noted  . Chest pain 03/12/2019  . Shortness of breath   . Depression   . Anxiety   . 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 No pertinent surgical history.     Family History  Problem Relation Age of Onset  . Inflammatory bowel disease Daughter   . Colon cancer Neg Hx   . Esophageal cancer Neg Hx   . Pancreatic cancer Neg Hx   . Stomach cancer Neg Hx   . Liver disease Neg Hx     Social History   Tobacco Use  . Smoking status: Current Some Day Smoker    Types: Cigarettes  . Smokeless tobacco: Never Used  Vaping Use  . Vaping Use: Never used  Substance Use Topics  . Alcohol use: Yes    Comment: occasionally  . Drug use: Yes    Types: Marijuana    Home Medications Prior to Admission medications   Medication Sig Start Date End Date Taking? Authorizing Provider  albuterol (VENTOLIN HFA) 108 (90 Base) MCG/ACT inhaler Inhale 2 puffs into the lungs every 6 (six) hours as needed for wheezing or shortness of breath. 08/12/19   Argentina Donovan, PA-C  atorvastatin (LIPITOR) 20 MG tablet Take 1 tablet (20 mg total) by mouth daily at 6 PM. 08/12/19   McClung, Dionne Bucy, PA-C  DULoxetine (CYMBALTA) 60 MG capsule Take 1 capsule (60 mg total) by mouth daily. 08/12/19   Argentina Donovan, PA-C  HYDROcodone-acetaminophen (NORCO/VICODIN) 5-325 MG tablet Take 1 tablet by mouth every 4 (four) hours as needed. 12/09/20   Henderly, Britni A, PA-C  lisinopril (ZESTRIL) 20 MG tablet Take 1 tablet (20 mg total) by mouth daily. 08/12/19   McClung,  Dionne Bucy, PA-C  ondansetron (ZOFRAN) 4 MG tablet Take 1 tablet (4 mg total) by mouth every 6 (six) hours as needed for nausea. Patient not taking: No sig reported [id number]   Delora Fuel, MD  oxyCODONE (ROXICODONE) 5 MG immediate release tablet Take 1 tablet (5 mg total) by mouth every 6 (six) hours as needed for up to 10 days for severe pain. 12/06/20 12/16/20  Doran Stabler, MD  Spacer/Aero-Holding Chambers (AEROCHAMBER PLUS WITH MASK) inhaler Use with inhaler Patient not taking: No sig reported 08/12/19   Argentina Donovan, PA-C   SYMBICORT 80-4.5 MCG/ACT inhaler Inhale 2 puffs into the lungs in the morning and at bedtime. 09/09/20   [provider]  tamsulosin (FLOMAX) 0.4 MG CAPS capsule Take 1 capsule (0.4 mg total) by mouth daily. 05/15/19   Noemi Chapel, MD  umeclidinium bromide (INCRUSE ELLIPTA) 62.5 MCG/INH AEPB Inhale 1 puff into the lungs daily. 08/12/19   Argentina Donovan, PA-C    Allergies    Codeine  Review of Systems   Review of Systems  Constitutional: Negative for chills and fever.  HENT: Negative for congestion.   Respiratory: Negative for shortness of breath.   Cardiovascular: Positive for chest pain.  Gastrointestinal: Positive for abdominal pain. Negative for diarrhea, nausea and vomiting.  Genitourinary: Negative for dysuria, enuresis, flank pain, penile discharge and scrotal swelling.  Musculoskeletal: Negative for back pain.  Skin: Negative for wound.  Neurological: Negative for headaches.  Hematological: Does not bruise/bleed easily.    Physical Exam Updated Vital Signs BP (!) 142/96   Pulse 99   Temp 97.7 F (36.5 C) (Oral)   Resp 18   Ht 5\' 11"  (1.803 m)   Wt 63.7 kg   SpO2 96%   BMI 19.58 kg/m   Physical Exam Vitals and nursing note reviewed. Exam conducted with a chaperone present.  Constitutional:      General: He is not in acute distress.    Appearance: He is not ill-appearing.  HENT:     Head: Normocephalic and atraumatic.     Nose: No congestion.  Eyes:     Conjunctiva/sclera: Conjunctivae normal.  Cardiovascular:     Rate and Rhythm: Normal rate and regular rhythm.     Pulses: Normal pulses.     Heart sounds: No murmur heard. No friction rub. No gallop.   Pulmonary:     Effort: No respiratory distress.     Breath sounds: No wheezing, rhonchi or rales.  Abdominal:     Palpations: Abdomen is soft.     Tenderness: There is abdominal tenderness. There is no right CVA tenderness or left CVA tenderness.     Comments: Patient's abdomen was visualized,  is nondistended, normoactive bowel sounds, he had tenderness to palpation in the left upper quadrant, no rebound tenderness, no peritoneal sign noted.  Genitourinary:    Comments: With chaperone present genital exam was performed, there is no noted mass along patient's groin or other gross abnormalities noted.  Unable to feel mass groin mass with increased abdominal pressure, testicles were nontender to palpation, no penile discharge noted. Musculoskeletal:     Right lower leg: No edema.     Left lower leg: No edema.     Comments: Patient is moving all 4 extremities out difficulty  Skin:    General: Skin is warm and dry.  Neurological:     Mental Status: He is alert.  Psychiatric:        Mood and Affect: Mood  normal.     ED Results / Procedures / Treatments   Labs (all labs ordered are listed, but only abnormal results are displayed) Labs Reviewed  BASIC METABOLIC PANEL - Abnormal; Notable for the following components:      Result Value   Glucose, Bld 106 (*)    All other components within normal limits  CBC WITH DIFFERENTIAL/PLATELET  LIPASE, BLOOD  URINALYSIS, ROUTINE W REFLEX MICROSCOPIC  TROPONIN I (HIGH SENSITIVITY)  TROPONIN I (HIGH SENSITIVITY)    EKG EKG Interpretation  Date/Time:  Thursday December 15 2020 21:51:51 EST Ventricular Rate:  89 PR Interval:    QRS Duration: 72 QT Interval:  357 QTC Calculation: 435 R Axis:   50 Text Interpretation: Sinus rhythm No significant change since 05/15/2019 Confirmed by Geoffery Lyons ([id number]) on 12/15/2020 10:11:07 PM   Radiology DG Chest Port 1 View  Result Date: 12/15/2020 CLINICAL DATA:  Chest pain.  COPD.  Current smoker. EXAM: PORTABLE CHEST 1 VIEW COMPARISON:  05/15/2019 FINDINGS: Examination is limited due to patient positioning. Tortuous aorta. Heart size is normal. Lungs are clear. No pleural effusions. No pneumothorax. IMPRESSION: No evidence of active pulmonary disease. Electronically Signed   By: Burman Nieves M.D.   On: 12/15/2020 20:59    Procedures Procedures (including critical care time)  Medications Ordered in ED Medications - No data to display  ED Course  I have reviewed the triage vital signs and the nursing notes.  Pertinent labs & imaging results that were available during my care of the patient were reviewed by me and considered in my medical decision making (see chart for details).   CBC negative for leukocytosis or signs of anemia Chest x-ray does not reveal any acute findings, EKG sinus rhythm without signs of ischemia no ST elevation or depression noted.  MDM Rules/Calculators/A&P                          Patient presents with chest pain and left upper quadrant pain.  He is alert, does not appear in acute distress, vital signs reassuring.  Will obtain chest pain work-up, basic lab work-up and reevaluate.  Patient is reassessed, states he feels unchanged this time, vital signs remained stable.  CBC shows no signs of leukocytosis or signs of anemia.  BMP negative for electrolyte abnormalities, no metabolic acidosis, hyperglycemia 106, no AKI, no anion gap present.  Lipase is 28, troponin is 4.  Chest x-ray does not reveal any acute findings.  EKG sinus rhythm without signs of ischemia no ST elevation depression noted.  I have low suspicion for ACS as history is atypical, patient has no cardiac history, EKG was sinus rhythm without signs of ischemia, patient initial  troponin of 4.  Will defer second troponin as patient has had chest pain for over 1 week, acute cardiac abnormality unlikely.  Low suspicion for PE as patient denies pleuritic chest pain, shortness of breath, patient denies leg pain, no pedal edema noted on exam, vital signs reassuring. low suspicion for AAA or aortic dissection as history is atypical, patient has low risk factors, no bulging mass during my exam.  Low suspicion for systemic infection as patient is nontoxic-appearing, vital signs reassuring, no  obvious source infection noted on exam.  Low suspicion for acute intra-abdominal abnormality requiring immediate intervention as patient tolerating p.o., no peritoneal sign noted on my exam.  Will defer imaging at this time as patient had CT abdomen pelvis on 12/24 which did  not reveal any acute finding unlikely for significant changes over the last 6 days.  I suspect patient's pain may be secondary due to malignancy, will recommend he follows up with his oncologist Dr. Annamaria Boots on January 3 for further evaluation.  Vital signs have remained stable, no indication for hospital admission.  Patient discussed with attending and they agreed with assessment and plan.  Patient given at home care as well strict return precautions.  Patient verbalized that they understood agreed to said plan.   Final Clinical Impression(s) / ED Diagnoses Final diagnoses:  LLQ abdominal pain  Atypical chest pain    Rx / DC Orders ED Discharge Orders    None       Marcello Fennel, PA-C 12/15/20 2313    Veryl Speak, MD 12/16/20 [phone]

## 2020-12-19 ENCOUNTER — Encounter: Payer: Self-pay | Admitting: Hematology

## 2020-12-19 ENCOUNTER — Inpatient Hospital Stay: Payer: Medicaid Other | Attending: Hematology | Admitting: Hematology

## 2020-12-19 ENCOUNTER — Other Ambulatory Visit: Payer: Self-pay

## 2020-12-19 DIAGNOSIS — C220 Liver cell carcinoma: Secondary | ICD-10-CM | POA: Insufficient documentation

## 2020-12-19 DIAGNOSIS — Z79899 Other long term (current) drug therapy: Secondary | ICD-10-CM | POA: Insufficient documentation

## 2020-12-19 DIAGNOSIS — F0390 Unspecified dementia without behavioral disturbance: Secondary | ICD-10-CM | POA: Insufficient documentation

## 2020-12-19 DIAGNOSIS — J449 Chronic obstructive pulmonary disease, unspecified: Secondary | ICD-10-CM | POA: Diagnosis not present

## 2020-12-19 DIAGNOSIS — E785 Hyperlipidemia, unspecified: Secondary | ICD-10-CM | POA: Diagnosis not present

## 2020-12-19 DIAGNOSIS — K7460 Unspecified cirrhosis of liver: Secondary | ICD-10-CM | POA: Insufficient documentation

## 2020-12-19 DIAGNOSIS — F17210 Nicotine dependence, cigarettes, uncomplicated: Secondary | ICD-10-CM | POA: Insufficient documentation

## 2020-12-19 DIAGNOSIS — E119 Type 2 diabetes mellitus without complications: Secondary | ICD-10-CM | POA: Diagnosis not present

## 2020-12-19 DIAGNOSIS — I10 Essential (primary) hypertension: Secondary | ICD-10-CM | POA: Insufficient documentation

## 2020-12-19 DIAGNOSIS — R109 Unspecified abdominal pain: Secondary | ICD-10-CM

## 2020-12-19 DIAGNOSIS — F32A Depression, unspecified: Secondary | ICD-10-CM | POA: Insufficient documentation

## 2020-12-19 DIAGNOSIS — F329 Major depressive disorder, single episode, unspecified: Secondary | ICD-10-CM

## 2020-12-19 DIAGNOSIS — R1030 Lower abdominal pain, unspecified: Secondary | ICD-10-CM | POA: Insufficient documentation

## 2020-12-19 MED ORDER — HYDROCODONE-ACETAMINOPHEN 5-325 MG PO TABS: 1.0000 | ORAL_TABLET | Freq: Four times a day (QID) | ORAL | 0 refills | Status: AC | PRN

## 2020-12-19 NOTE — Progress Notes (Signed)
[facility]   [phone] Fax:([phone]   Clinic New Consult Note   Patient Care Team: Norm Salt, [place] as PCP - General (Physician Assistant) Radonna Ricker, RN as Oncology Nurse Navigator Malachy Mood, MD as Consulting Physician (Oncology)  Date of Service:  12/19/2020   CHIEF COMPLAINTS/PURPOSE OF CONSULTATION:  Newly Diagnosed Liver Cancer  REFERRING PHYSICIAN:  Dr. Myrtie Neither   Oncology History Overview Note  Cancer Staging Hepatocellular carcinoma [facility]) Staging form: Liver, AJCC 8th Edition - Clinical stage from 12/13/2020: Stage IVA (cT3, cN1, cM0) - Signed by Malachy Mood, MD on 12/19/2020    Hepatocellular carcinoma (HCC)  11/11/2020 Imaging   CT AP 11/11/20  IMPRESSION: 1. Significant interval progression of previously demonstrated enhancing mass in the left hepatic lobe consistent with progressive hepatocellular carcinoma. There is now near complete replacement of the left hepatic lobe, with possible tumor thrombus in the left portal and left hepatic veins. 2. Stable mildly enlarged gastrohepatic ligament lymph nodes, nonspecific and possibly related to underlying cirrhosis. Enlarging precordial lymph node could reflect a metastasis. 3. No other evidence of metastatic disease or acute findings. 4. Aortic Atherosclerosis (ICD10-I70.0).   11/11/2020 Tumor Marker   AFP 27.4   12/09/2020 Imaging   CT AP 12/09/20 IMPRESSION: 1. Examination is generally somewhat limited by patient arm positioning over the central abdomen with metallic streak artifact related to rings and jewelry. 2. Within this limitation, no acute CT findings of the abdomen or pelvis to explain right lower quadrant abdominal pain. Normal appendix. 3. Redemonstrated, multilobulated heterogeneously enhancing mass replacing essentially the entire left lobe of the liver, measuring approximately 12.7 x 7.0 cm in axial dimension, not significantly changed compared to  prior CT and consistent with hepatocellular carcinoma. 4. Redemonstrated enlarged portacaval and celiac axis lymph nodes measuring up to 1.7 x 1.0 cm. Aortic Atherosclerosis (ICD10-I70.0).   12/13/2020 Cancer Staging   Staging form: Liver, AJCC 8th Edition - Clinical stage from 12/13/2020: Stage IVA (cT3, cN1, cM0) - Signed by Malachy Mood, MD on 12/19/2020   12/13/2020 Initial Biopsy   FINAL MICROSCOPIC DIAGNOSIS: 12/13/20  A. LIVER, LEFT LOBE, BIOPSY:  - Well to moderately-differentiated hepatocellular carcinoma, see  comment  COMMENT:  Dr. Luisa Hart reviewed the case and concurs with the diagnosis.  Dr. Myrtie Neither  was notified on 12/14/2020.    12/19/2020 Initial Diagnosis   Hepatocellular carcinoma (HCC)      HISTORY OF PRESENTING ILLNESS:  Devin Myers 65 y.o. male is a here because of newly diagnosed liver cancer. The patient presents to the clinic today accompanied by family friend [place]. He is not the best historian given recently diagnosed dementia. His daughter Inda Merlin was called to be included in the visit.   He has had diffuse abdominal and pain in his right groin with swelling. He has had intermittent mid abdominal pain for over a year but has worsened lately. He will take ibuprofen 800mg  several times a day. His pain will now reach 10/10 and occurs daily. He notes currently have and is tender to touch. This has effected his daily living. He notes he now sits most of the time. He was able to walk 5 miles daily until recent frequent ED visits. He has lost 20. He was previously 160-170 pounds. He has desire to eat and can keep it down. He cannot cook for long due to his dementia. He notes his oldest daughter has Hep C. He denies upper chest pain, but has SOB from COPD.  Socially he has a lot of concerns. He has beginning stages of dementia diagnosed by free clinic in [place]. His PCP is Dr. Smith Mince who he say 2 months ago. He lives alone but notes decrease in his cooking because of  his memory. He smoked since he was early 37s and smokes currently 3 cigarettes a day. He has reduced his drinking to occasionally and has not had dependence. He notes he uses Marijuana almost daily to help him go to sleep. He is divorced and has 3 daughters, but only 1 lives in town. He does not have much support at home. He does have some support from a long time friend. He is on disability from mental and physical disabilities. He is anxious about his diagnosis.   He has a PMHx of. COPD, on inhaler. He has depression on Cymbalta, DM, HTN on Lisinopril, depression. He denies family history of cancer.    REVIEW OF SYSTEMS: Constitutional: Denies fevers, chills or abnormal night sweats Eyes: Denies blurriness of vision, double vision or watery eyes Ears, nose, mouth, throat, and face: Denies mucositis or sore throat Respiratory: Denies cough, dyspnea or wheezes Cardiovascular: Denies palpitation, chest discomfort or lower extremity swelling (+) Worsened Diffuse abdominal pain (+) Left groin pain  Gastrointestinal:  Denies nausea, heartburn or change in bowel habits Skin: Denies abnormal skin rashes Lymphatics: Denies new lymphadenopathy or easy bruising Neurological:Denies numbness, tingling or new weaknesses (+) Anxiety and Depression Behavioral/Psych: Mood is stable, no new changes  All other systems were reviewed with the patient and are negative.   MEDICAL HISTORY:  Past Medical History:  Diagnosis Date  . COPD (chronic obstructive pulmonary disease) ([place])   . Dementia ([place])   . Depression   . Diabetes mellitus without complication ([place])   . Hypertension     SURGICAL HISTORY: History reviewed. No pertinent surgical history.  SOCIAL HISTORY: Social History   Socioeconomic History  . Marital status: Divorced    Spouse name: Not on file  . Number of children: 3  . Years of education: Not on file  . Highest education level: Not on file  Occupational History    Employer:  [id number]  Tobacco Use  . Smoking status: Current Some Day Smoker    Packs/day: 0.25    Years: 40.00    Pack years: 10.00    Types: Cigarettes  . Smokeless tobacco: Never Used  Vaping Use  . Vaping Use: Never used  Substance and Sexual Activity  . Alcohol use: Yes    Comment: occasionally  . Drug use: Yes    Types: Marijuana  . Sexual activity: Not Currently  Other Topics Concern  . Not on file  Social History Narrative  . Not on file   Social Determinants of Health   Financial Resource Strain: Not on file  Food Insecurity: Not on file  Transportation Needs: Not on file  Physical Activity: Not on file  Stress: Not on file  Social Connections: Not on file  Intimate Partner Violence: Not on file    FAMILY HISTORY: Family History  Problem Relation Age of Onset  . Inflammatory bowel disease Daughter   . Colon cancer Neg Hx   . Esophageal cancer Neg Hx   . Pancreatic cancer Neg Hx   . Stomach cancer Neg Hx   . Liver disease Neg Hx     ALLERGIES:  is allergic to codeine.  MEDICATIONS:  Current Outpatient Medications  Medication Sig Dispense Refill  . albuterol (VENTOLIN HFA) 108 (90 Base)  MCG/ACT inhaler Inhale 2 puffs into the lungs every 6 (six) hours as needed for wheezing or shortness of breath. 18 g 1  . atorvastatin (LIPITOR) 20 MG tablet Take 1 tablet (20 mg total) by mouth daily at 6 PM. 30 tablet 3  . DULoxetine (CYMBALTA) 60 MG capsule Take 1 capsule (60 mg total) by mouth daily. 30 capsule 1  . HYDROcodone-acetaminophen (NORCO/VICODIN) 5-325 MG tablet Take 1 tablet by mouth every 6 (six) hours as needed. 64 tablet 0  . lisinopril (ZESTRIL) 20 MG tablet Take 1 tablet (20 mg total) by mouth daily. 90 tablet 1  . ondansetron (ZOFRAN) 4 MG tablet Take 1 tablet (4 mg total) by mouth every 6 (six) hours as needed for nausea. (Patient not taking: No sig reported) 20 tablet 0  . Spacer/Aero-Holding Chambers (AEROCHAMBER PLUS WITH MASK) inhaler Use with inhaler  (Patient not taking: No sig reported) 1 each 0  . SYMBICORT 80-4.5 MCG/ACT inhaler Inhale 2 puffs into the lungs in the morning and at bedtime.    . tamsulosin (FLOMAX) 0.4 MG CAPS capsule Take 1 capsule (0.4 mg total) by mouth daily. 30 capsule 0  . umeclidinium bromide (INCRUSE ELLIPTA) 62.5 MCG/INH AEPB Inhale 1 puff into the lungs daily. 30 each 0   No current facility-administered medications for this visit.    PHYSICAL EXAMINATION: ECOG PERFORMANCE STATUS: 2 - Symptomatic, <50% confined to bed  Vitals:   12/19/20 1551  BP: (!) 155/99  Pulse: 80  Resp: 18  Temp: (!) 97 F (36.1 C)  SpO2: 100%   Filed Weights   12/19/20 1551  Weight: 147 lb 9.6 oz (67 kg)    GENERAL:alert, no distress and comfortable SKIN: skin color, texture, turgor are normal, no rashes or significant lesions EYES: normal, Conjunctiva are pink and non-injected, sclera clear  NECK: supple, thyroid normal size, non-tender, without nodularity LYMPH:  no palpable lymphadenopathy in the cervical, axillary  LUNGS: clear to auscultation and percussion with normal breathing effort HEART: regular rate & rhythm and no murmurs and no lower extremity edema ABDOMEN:abdomen soft, non-tender and normal bowel sounds (+) Left hepatomegaly with tenderness (+) Diffuse abdominal tenderness Musculoskeletal:no cyanosis of digits and no clubbing  NEURO: alert & oriented x 3 with fluent speech, no focal motor/sensory deficits  LABORATORY DATA:  I have reviewed the data as listed CBC Latest Ref Rng & Units 12/15/2020 12/13/2020 12/09/2020  WBC 4.0 - 10.5 K/uL 6.1 4.7 4.7  Hemoglobin 13.0 - 17.0 g/dL 15.6 14.6 14.5  Hematocrit 39.0 - 52.0 % 45.8 43.9 43.6  Platelets 150 - 400 K/uL 191 146(L) 183    CMP Latest Ref Rng & Units 12/15/2020 12/09/2020 11/30/2020  Glucose 70 - 99 mg/dL 106(H) 87 -  BUN 8 - 23 mg/dL 22 14 -  Creatinine 0.61 - 1.24 mg/dL 1.12 0.91 -  Sodium 135 - 145 mmol/L 141 141 -  Potassium 3.5 - 5.1 mmol/L  4.1 3.8 -  Chloride 98 - 111 mmol/L 105 104 -  CO2 22 - 32 mmol/L 26 29 -  Calcium 8.9 - 10.3 mg/dL 9.9 9.3 -  Total Protein 6.5 - 8.1 g/dL - 8.0 8.1  Total Bilirubin 0.3 - 1.2 mg/dL - 0.9 0.9  Alkaline Phos 38 - 126 U/L - 75 83  AST 15 - 41 U/L - 93(H) 78(H)  ALT 0 - 44 U/L - 167(H) 138(H)     RADIOGRAPHIC STUDIES: I have personally reviewed the radiological images as listed and agreed with the findings  in the report. CT Abdomen Pelvis W Contrast  Result Date: 12/09/2020 CLINICAL DATA:  Right lower quadrant pain this morning, 10-15 pounds weight loss, hepatocellular carcinoma EXAM: CT ABDOMEN AND PELVIS WITH CONTRAST TECHNIQUE: Multidetector CT imaging of the abdomen and pelvis was performed using the standard protocol following bolus administration of intravenous contrast. CONTRAST:  193mL OMNIPAQUE IOHEXOL 300 MG/ML  SOLN COMPARISON:  11/11/2020 FINDINGS: Examination is generally somewhat limited by patient arm positioning over the central abdomen with metallic streak artifact related to rings and jewelry. Lower chest: No acute abnormality. Hepatobiliary: Redemonstrated, multilobulated heterogeneously enhancing mass replacing essentially the entire left lobe of the liver, measuring approximately 12.7 x 7.0 cm in axial dimension, not significantly changed compared to prior CT (series 2, image 19). No gallstones, gallbladder wall thickening, or biliary dilatation. Pancreas: Unremarkable. No pancreatic ductal dilatation or surrounding inflammatory changes. Spleen: Normal in size without significant abnormality. Adrenals/Urinary Tract: Adrenal glands are unremarkable. Kidneys are normal, without renal calculi, solid lesion, or hydronephrosis. Bladder is unremarkable. Stomach/Bowel: Stomach is within normal limits. Appendix appears normal. No evidence of bowel wall thickening, distention, or inflammatory changes. Vascular/Lymphatic: Aortic atherosclerosis. Redemonstrated enlarged portacaval and celiac  axis lymph nodes measuring up to 1.7 x 1.0 cm (series 2, image 19). Reproductive: No mass or other significant abnormality. Other: No abdominal wall hernia or abnormality. No abdominopelvic ascites. Musculoskeletal: No acute or significant osseous findings. IMPRESSION: 1. Examination is generally somewhat limited by patient arm positioning over the central abdomen with metallic streak artifact related to rings and jewelry. 2. Within this limitation, no acute CT findings of the abdomen or pelvis to explain right lower quadrant abdominal pain. Normal appendix. 3. Redemonstrated, multilobulated heterogeneously enhancing mass replacing essentially the entire left lobe of the liver, measuring approximately 12.7 x 7.0 cm in axial dimension, not significantly changed compared to prior CT and consistent with hepatocellular carcinoma. 4. Redemonstrated enlarged portacaval and celiac axis lymph nodes measuring up to 1.7 x 1.0 cm. Aortic Atherosclerosis (ICD10-I70.0). Electronically Signed   By: Eddie Candle M.D.   On: 12/09/2020 14:42   US BIOPSY (LIVER)  Result Date: 12/13/2020 INDICATION: Large lobulated mass occupying much of the left lobe of the liver and suspicious for hepatocellular carcinoma by imaging. Tissue biopsy has been requested prior to oncologic referral. EXAM: ULTRASOUND GUIDED CORE BIOPSY OF LIVER MEDICATIONS: None. ANESTHESIA/SEDATION: Fentanyl 100 mcg IV; Versed 2.0 mg IV Moderate Sedation Time:  14 minutes. The patient was continuously monitored during the procedure by the interventional radiology nurse under my direct supervision. PROCEDURE: The procedure, risks, benefits, and alternatives were explained to the patient. Questions regarding the procedure were encouraged and answered. The patient understands and consents to the procedure. A time-out was performed prior to initiating the procedure. Ultrasound was performed to localize tumor within the left lobe of the liver. The epigastric abdominal  wall was prepped with chlorhexidine in a sterile fashion, and a sterile drape was applied covering the operative field. A sterile gown and sterile gloves were used for the procedure. Local anesthesia was provided with 1% Lidocaine. Under ultrasound guidance, a 17 gauge trocar needle was advanced to the level of the left lobe of the liver. After confirming needle tip position, 3 separate coaxial 18 gauge core biopsy samples were obtained and submitted in formalin. A slurry of Gel-Foam pledgets was then injected through the outer needle as the needle was retracted and removed. Additional ultrasound was performed. COMPLICATIONS: None immediate. FINDINGS: Large volume lobulated tumor occupying the left lobe of  the liver was localized. Solid tissue was obtained from the mass. IMPRESSION: Ultrasound-guided core biopsy performed at the level of the large, lobulated left lobe hepatic mass. Electronically Signed   By: Aletta Edouard M.D.   On: 12/13/2020 15:16   DG Chest Port 1 View  Result Date: 12/15/2020 CLINICAL DATA:  Chest pain.  COPD.  Current smoker. EXAM: PORTABLE CHEST 1 VIEW COMPARISON:  05/15/2019 FINDINGS: Examination is limited due to patient positioning. Tortuous aorta. Heart size is normal. Lungs are clear. No pleural effusions. No pneumothorax. IMPRESSION: No evidence of active pulmonary disease. Electronically Signed   By: Lucienne Capers M.D.   On: 12/15/2020 20:59    ASSESSMENT & PLAN:  Devin Myers is a 65 y.o. African American male with a history of Depression, COPD, HTN, DM, HLD.    1. Hepatocellular carcinoma, cT3N1Mx with enlarged RP nodes -At 09/09/19 ED visit he was found to have Liver cirrhosis and multiple liver mass in left lobe concerning for multifocal liver cancer on CT. Pt did not follow up as recommended.  -After over a year of intermittent abdominal pain his pain increased recently. His CT AP form 11/11/20 showed increased progression of liver mass in left lobe (12.7cm on  12/09/20 CT AP) and Enlarging precordial, portacaval and celiac axis lymph nodes which could reflect metastasis. No other evidence of distant metastatic disease or acute findings. I reviewed the images myself  -His 12/13/20 Liver biopsy confirmed Well to moderately-differentiated hepatocellular carcinoma. I discussed with pt and his family and gave him a copy of the report  -I recommend PET scan to complete staging, and evaluate PR nodes status. He is agreeable.  -I discussed given the size of his liver mass and possible retroperitoneal LN involvement his cancer is unlikely curable by surgery at this stage, and he is not a candidate for liver transplant. I will discuss his case in GI Tumor board to see if he is eligible for liver targeted therapy such as Y90. I discussed these options are palliative and prolong his life.  -I also discussed the option of systemic treatment with IV Immunotherapy and VGFR inhibitor or oral TKI such as Lenvima to control his disease and prolong his life. I discussed this has moderate efficiency. Side effects were reviewed with pt and his daughter in details  -His daughter is leaning toward oral treatment or Y90 to start. Will re-evaluate based on Tumor Board discussion.  -I discussed Hospice home care will be an option if he is declining past treatment care or no longer wants to proceed with treatment.  -F/u in 2 weeks.    2. Liver Cirrhosis, unknown etiology, Child-Pugh class A -At 09/09/19 ED visit he was seen to have Liver cirrhosis and liver mass concerning for liver cancer on CT AP. Pt did not follow up as recommended.  -I discussed possible etiology such as fatty liver or Hepatitis. He denies history of IV drug use, nut notes his oldest daughter has Hep C.  -Will obtain further testing for Hep C. I also recommend EGD in the next few months to evaluate for esophageal Varices.    3. Abdominal pain  -After over a year of intermittent abdominal pain his pain increased  recently. He notes upper and lower abdominal pain is now 10/10. His pain is likely secondary to his liver cancer  -Ibuprofen 800mg  is not controlling pain. He has hydrocodone but does not take it enough given dementia. I discussed this is a controlled substance and should have help  managing it and watch his use as I will not refill sooner. His friend and daughter are agreeable.  -I refilled Hydrocodone up to q6hours today only at 2 weeks supply (12/20/19).    4. Comorbidities: Depression, COPD, HTN, DM, HLD -On Lisinopril, Cymbalta, Lipitor, Inhaler, Flomax   5. Social Support, Dementia  -He is out of work on Disability from mental and physical ailments. He also has medicaid. -He was recently diagnosed with early stage dementia in 2021. He has moderate memory of history but will often forget medications, etc.  -He currently lives alone. He has struggles with ADLs and cooking given his dementia and that has effected his weight.  -He is divorced and has 3 adult daughters, 68 ([place]) which lives in town. He has a close friend [place] in town as well. He overall has moderate social support, but given dementia needs more at home help due to decreased ability to do ADLs.  -I will refer him to palliative care service, [facility], but this does not cover personal care. I will refer him to our social worker and dietician.  -He is anxious and emotional about his diagnosis and concern for prognosis. SW counseling    PLAN:  -Send SW and dietician referral.  -Lab and PET in 1-2 weeks  -I called in Hydrocodone #60 today  -Lab and F/u in 2 weeks.    Orders Placed This Encounter  Procedures  . NM PET Image Initial (PI) Skull Base To Thigh    Standing Status:   Future    Standing Expiration Date:   12/19/2021    Order Specific Question:   If indicated for the ordered procedure, I authorize the administration of a radiopharmaceutical per Radiology protocol    Answer:   Yes    Order Specific Question:    Preferred imaging location?    Answer:   [facility]  . Ambulatory referral to Social Work    Referral Priority:   Routine    Referral Type:   Consultation    Referral Reason:   Specialty Services Required    Number of Visits Requested:   1  . Ambulatory referral to Social Work    Referral Priority:   Routine    Referral Type:   Consultation    Referral Reason:   Specialty Services Required    Number of Visits Requested:   1  . Amb Referral to Nutrition and Diabetic E    Referral Priority:   Routine    Referral Type:   Consultation    Referral Reason:   Specialty Services Required    Number of Visits Requested:   1    All questions were answered. The patient knows to call the clinic with any problems, questions or concerns. The total time spent in the appointment was 60 minutes.     Truitt Merle, MD 12/19/2020 6:42 PM  I, Joslyn Devon, am acting as scribe for Truitt Merle, MD.   I have reviewed the above documentation for accuracy and completeness, and I agree with the above.

## 2020-12-20 ENCOUNTER — Telehealth: Payer: Self-pay | Admitting: Hematology

## 2020-12-20 ENCOUNTER — Other Ambulatory Visit: Payer: Self-pay

## 2020-12-20 ENCOUNTER — Encounter: Payer: Self-pay | Admitting: General Practice

## 2020-12-20 ENCOUNTER — Encounter: Payer: Self-pay | Admitting: Hematology

## 2020-12-20 DIAGNOSIS — C220 Liver cell carcinoma: Secondary | ICD-10-CM

## 2020-12-20 NOTE — Progress Notes (Signed)
[facility] CSW Progress Notes  Call to patient, unable to reach on home phone.  Called mobile - this is daughter Radiation protection practitioner phone.  Daughter has not had consistent contact with patient for past year, became reinvolved with patient when he was diagnosed with cancer. He moved to [place] approx 3 years ago after his living situation in [place] fell apart.  She relates that he lives alone.   Has always had someone who cared for him - girlfriends took care of all household chores, Landscape architect and similar - he did not have to learn to care for himself.  Per daughter, he is having difficulty with managing his food/nutrition needs.  Forgets food on the stove, does not know what he has in fridge, subsists on processed sweets and drinks.  Does not know how to use a a phone, cannot text, will not return phone calls.  Is on disability for COPD, "he has never had a work history."  Has difficulty managing money.  States that "he is starting to wander", forgets what day it is, sometimes cannot "figure out where he was."  Patient likes to walk, does this excessively.  "When he thinks he has an appointment, he will just get up and walk" in an effort to figure out what he needs to do.   Has no formal diagnosis of dementia.  Per daughter, she is certain that he has depression, ADHD.  Has no PCP at this point in time. Daughter will contact [facility] to schedule appt for primary care.  Note placed in chart - patient has current [place] Medicaid card in his wallet, needs to present at registration for scanning.   CSW will refer to Meals on Wheels (there is a waiting list for this service).  CSW will discuss possible referral for [facility] w oncologist.  Daughter has my contact information and is encouraged to keep in touch re patient's needs.  Santa Genera, LCSW Clinical Social Worker Phone:  [phone]

## 2020-12-20 NOTE — Telephone Encounter (Signed)
Scheduled appts per 1/3 los. Left voicemail with appt dates/times.

## 2020-12-20 NOTE — Telephone Encounter (Signed)
I reviewed message with Dr Mosetta Putt.  Mr Gunnoe did pick up the norco prescription yesterday.  Per Dr Mosetta Putt he can take 2 tablets every 6 hours prn.  If this does not control his pain he will need to bring in the remainder of the norco tablets before she will prescribe another pain medication. Dr Mosetta Putt also wants the family to control the number of norco tablets he has in his possession at one time.  I reviewed this with his daughter, Moishe Spice.  She verbalized understanding.

## 2020-12-20 NOTE — Progress Notes (Signed)
Met with patient and a male friend at his initial medical oncology consult with Dr. Truitt Merle.  I explained my role as nurse navigator.  He verbalized an understanding.  There are concerns regarding the patient's mental status as his friend notes he has dementia.  Dr. Burr Medico has made a referral to CSW to see what resources are available to him as he is currently living alone.

## 2020-12-21 ENCOUNTER — Telehealth: Payer: Self-pay

## 2020-12-21 ENCOUNTER — Other Ambulatory Visit: Payer: Self-pay

## 2020-12-21 ENCOUNTER — Telehealth: Payer: Self-pay | Admitting: General Practice

## 2020-12-21 NOTE — Progress Notes (Signed)
The proposed treatment discussed in conference is for discussion purposes only and is not a binding recommendation.  The patients have not been physically examined, or presented with their treatment options.  Therefore, final treatment plans cannot be decided.   

## 2020-12-21 NOTE — Telephone Encounter (Signed)
[facility] CSW Progress Notes  Second call to patient to assess for needs/resources.  No answer, left VM w my contact information and request to return my call.  Santa Genera, LCSW Clinical Social Worker Phone:  [phone]

## 2020-12-21 NOTE — Telephone Encounter (Signed)
Spoke with patient's daughter Moishe Spice and scheduled an in-person Palliative Consult for 12/30/20 @ 8:30AM  COVID screening was negative. No pets in home. Patient lives alone. Daughter will be at consult appointment.   Consent obtained; updated Outlook/Netsmart/Team List and Epic.

## 2020-12-23 ENCOUNTER — Telehealth: Payer: Self-pay

## 2020-12-23 ENCOUNTER — Telehealth: Payer: Self-pay | Admitting: Hematology

## 2020-12-23 NOTE — Progress Notes (Signed)
Called patient regarding his appointments scheduled by Dr. Burr Medico. However he states he is at the [facility] and will call me back when he is done.  I have given him my direct phone number.  I am putting a calendar of his January 2022 appointments in the mail with notes regarding NPO status for PET scan just in case he does not call me back.

## 2020-12-23 NOTE — Telephone Encounter (Signed)
Called pt per 1/7 sch msg - left message for pt with apt date and time

## 2020-12-23 NOTE — Telephone Encounter (Signed)
Patient calls me back and I reviewed his appointments.  I have mailed him a calendar as well.

## 2020-12-26 ENCOUNTER — Other Ambulatory Visit: Payer: Self-pay | Admitting: Hematology

## 2020-12-26 DIAGNOSIS — C220 Liver cell carcinoma: Secondary | ICD-10-CM

## 2020-12-27 ENCOUNTER — Inpatient Hospital Stay: Payer: Medicaid Other

## 2020-12-27 ENCOUNTER — Encounter: Payer: Self-pay | Admitting: Hematology

## 2020-12-27 ENCOUNTER — Emergency Department (HOSPITAL_COMMUNITY)
Admission: EM | Admit: 2020-12-27 | Discharge: 2020-12-28 | Disposition: A | Discharge status: Home / Self Care | Payer: Medicaid Other | Attending: Emergency Medicine | Admitting: Emergency Medicine

## 2020-12-27 ENCOUNTER — Other Ambulatory Visit: Payer: Self-pay

## 2020-12-27 DIAGNOSIS — F17210 Nicotine dependence, cigarettes, uncomplicated: Secondary | ICD-10-CM | POA: Diagnosis not present

## 2020-12-27 DIAGNOSIS — Z79899 Other long term (current) drug therapy: Secondary | ICD-10-CM | POA: Insufficient documentation

## 2020-12-27 DIAGNOSIS — K4090 Unilateral inguinal hernia, without obstruction or gangrene, not specified as recurrent: Secondary | ICD-10-CM | POA: Insufficient documentation

## 2020-12-27 DIAGNOSIS — F0390 Unspecified dementia without behavioral disturbance: Secondary | ICD-10-CM | POA: Insufficient documentation

## 2020-12-27 DIAGNOSIS — R1031 Right lower quadrant pain: Secondary | ICD-10-CM | POA: Diagnosis present

## 2020-12-27 DIAGNOSIS — I10 Essential (primary) hypertension: Secondary | ICD-10-CM | POA: Insufficient documentation

## 2020-12-27 DIAGNOSIS — E119 Type 2 diabetes mellitus without complications: Secondary | ICD-10-CM | POA: Diagnosis not present

## 2020-12-27 DIAGNOSIS — Z7951 Long term (current) use of inhaled steroids: Secondary | ICD-10-CM | POA: Diagnosis not present

## 2020-12-27 DIAGNOSIS — R000 Tachycardia, unspecified: Secondary | ICD-10-CM | POA: Diagnosis not present

## 2020-12-27 DIAGNOSIS — J449 Chronic obstructive pulmonary disease, unspecified: Secondary | ICD-10-CM | POA: Insufficient documentation

## 2020-12-27 LAB — CBC WITH DIFFERENTIAL/PLATELET
Abs Immature Granulocytes: 0.01 10*3/uL (ref 0.00–0.07)
Basophils Absolute: 0 10*3/uL (ref 0.0–0.1)
Basophils Relative: 1 %
Eosinophils Absolute: 0.1 10*3/uL (ref 0.0–0.5)
Eosinophils Relative: 3 %
HCT: 43.2 % (ref 39.0–52.0)
Hemoglobin: 14 g/dL (ref 13.0–17.0)
Immature Granulocytes: 0 %
Lymphocytes Relative: 45 %
Lymphs Abs: 2.2 10*3/uL (ref 0.7–4.0)
MCH: 28.6 pg (ref 26.0–34.0)
MCHC: 32.4 g/dL (ref 30.0–36.0)
MCV: 88.3 fL (ref 80.0–100.0)
Monocytes Absolute: 0.5 10*3/uL (ref 0.1–1.0)
Monocytes Relative: 10 %
Neutro Abs: 2 10*3/uL (ref 1.7–7.7)
Neutrophils Relative %: 41 %
Platelets: 233 10*3/uL (ref 150–400)
RBC: 4.89 MIL/uL (ref 4.22–5.81)
RDW: 14 % (ref 11.5–15.5)
WBC: 4.8 10*3/uL (ref 4.0–10.5)
nRBC: 0 % (ref 0.0–0.2)

## 2020-12-27 LAB — COMPREHENSIVE METABOLIC PANEL
ALT: 156 U/L — ABNORMAL HIGH (ref 0–44)
AST: 109 U/L — ABNORMAL HIGH (ref 15–41)
Albumin: 3.5 g/dL (ref 3.5–5.0)
Alkaline Phosphatase: 79 U/L (ref 38–126)
Anion gap: 10 (ref 5–15)
BUN: 13 mg/dL (ref 8–23)
CO2: 28 mmol/L (ref 22–32)
Calcium: 9.3 mg/dL (ref 8.9–10.3)
Chloride: 103 mmol/L (ref 98–111)
Creatinine, Ser: 0.84 mg/dL (ref 0.61–1.24)
GFR, Estimated: >60 mL/min (ref >60)
Glucose, Bld: 105 mg/dL — ABNORMAL HIGH (ref 70–99)
Potassium: 3.9 mmol/L (ref 3.5–5.1)
Sodium: 141 mmol/L (ref 135–145)
Total Bilirubin: 0.6 mg/dL (ref 0.3–1.2)
Total Protein: 7.3 g/dL (ref 6.5–8.1)

## 2020-12-27 LAB — AMMONIA: Ammonia: 28 umol/L (ref 9–35)

## 2020-12-27 MED ORDER — HYDROMORPHONE HCL 1 MG/ML IJ SOLN
0.5000 mg | Freq: Once | INTRAMUSCULAR | Status: AC
  Administered 2020-12-27: 0.5 mg via INTRAVENOUS
  Filled 2020-12-27: qty 1

## 2020-12-27 MED ORDER — ONDANSETRON HCL 4 MG/2ML IJ SOLN
4.0000 mg | Freq: Once | INTRAMUSCULAR | Status: AC
  Administered 2020-12-27: 4 mg via INTRAVENOUS
  Filled 2020-12-27: qty 2

## 2020-12-27 NOTE — ED Notes (Signed)
[facility] called for patient. States that it will be over an hour wait. Pt using taxi voucher.

## 2020-12-27 NOTE — Discharge Instructions (Addendum)
Follow-up with the surgeons.  You may also be able to pick up a hernia truss at a medical supply store.  Also follow-up with your oncologist

## 2020-12-27 NOTE — ED Triage Notes (Signed)
Pt thinks he has a painful lump to R groin, thinks he has a hernia. Seen for same at [facility] on 12/30 and no mass was visualized.

## 2020-12-27 NOTE — ED Provider Notes (Signed)
[facility] EMERGENCY DEPARTMENT Provider Note   CSN: [id number] Arrival date & time: 12/27/20  1647     History Chief Complaint  Patient presents with  . Hernia    Jakhi Dishman is a 65 y.o. male.  HPI Patient presents with right groin pain and swelling.  States this comes and goes.  Has had similar previously.  Has known hepatocellular carcinoma.  States the mass has been there for the last 3 days.  No dysuria.  No fevers.  No nausea or vomiting.  States it hurts to touch the area.  No dysuria.    Past Medical History:  Diagnosis Date  . COPD (chronic obstructive pulmonary disease) ([place])   . Dementia ([place])   . Depression   . Diabetes mellitus without complication ([place])   . Hypertension     Patient Active Problem List   Diagnosis Date Noted  . Hepatocellular carcinoma ([place]) 12/19/2020  . Chest pain 03/12/2019  . Shortness of breath   . Depression   . Anxiety   . 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     Family History  Problem Relation Age of Onset  . Inflammatory bowel disease Daughter   . Colon cancer Neg Hx   . Esophageal cancer Neg Hx   . Pancreatic cancer Neg Hx   . Stomach cancer Neg Hx   . Liver disease Neg Hx     Social History   Tobacco Use  . Smoking status: Current Some Day Smoker    Packs/day: 0.25    Years: 40.00    Pack years: 10.00    Types: Cigarettes  . Smokeless tobacco: Never Used  Vaping Use  . Vaping Use: Never used  Substance Use Topics  . Alcohol use: Yes    Comment: occasionally  . Drug use: Yes    Types: Marijuana    Home Medications Prior to Admission medications   Medication Sig Start Date End Date Taking? Authorizing Provider  albuterol (VENTOLIN HFA) 108 (90 Base) MCG/ACT inhaler Inhale 2 puffs into the lungs every 6 (six) hours as needed for wheezing or shortness of breath. 08/12/19   Argentina Donovan, PA-C  atorvastatin (LIPITOR) 20 MG  tablet Take 1 tablet (20 mg total) by mouth daily at 6 PM. 08/12/19   McClung, Dionne Bucy, PA-C  DULoxetine (CYMBALTA) 60 MG capsule Take 1 capsule (60 mg total) by mouth daily. 08/12/19   Argentina Donovan, PA-C  HYDROcodone-acetaminophen (NORCO/VICODIN) 5-325 MG tablet Take 1 tablet by mouth every 6 (six) hours as needed. 12/19/20   Truitt Merle, MD  lisinopril (ZESTRIL) 20 MG tablet Take 1 tablet (20 mg total) by mouth daily. 08/12/19   Argentina Donovan, PA-C  ondansetron (ZOFRAN) 4 MG tablet Take 1 tablet (4 mg total) by mouth every 6 (six) hours as needed for nausea. Patient not taking: No sig reported 41/32/44   Delora Fuel, MD  Spacer/Aero-Holding Chambers (AEROCHAMBER PLUS WITH MASK) inhaler Use with inhaler Patient not taking: No sig reported 08/12/19   Argentina Donovan, PA-C  SYMBICORT 80-4.5 MCG/ACT inhaler Inhale 2 puffs into the lungs in the morning and at bedtime. 09/09/20   [provider]  tamsulosin (FLOMAX) 0.4 MG CAPS capsule Take 1 capsule (0.4 mg total) by mouth daily. 05/15/19   Noemi Chapel, MD  umeclidinium bromide (INCRUSE ELLIPTA) 62.5 MCG/INH AEPB Inhale 1 puff into the lungs daily. 08/12/19   Argentina Donovan, PA-C  Allergies    Codeine  Review of Systems   Review of Systems  Constitutional: Negative for appetite change.  HENT: Negative for congestion.   Respiratory: Negative for shortness of breath.   Cardiovascular: Negative for chest pain.  Gastrointestinal: Positive for abdominal pain.  Genitourinary: Negative for difficulty urinating.  Musculoskeletal: Negative for back pain.  Neurological: Negative for weakness.  Psychiatric/Behavioral: Negative for confusion.    Physical Exam Updated Vital Signs BP (!) 180/101 (BP Location: Right Arm)   Pulse 62   Temp 98.1 F (36.7 C) (Oral)   Resp 17   SpO2 99%   Physical Exam Vitals and nursing note reviewed.  HENT:     Head: Normocephalic.  Eyes:     Pupils: Pupils are equal, round, and reactive to  light.  Cardiovascular:     Rate and Rhythm: Tachycardia present.  Abdominal:     Tenderness: There is no abdominal tenderness.  Genitourinary:    Comments: Right-sided inguinal hernia somewhat medially.  No mass in scrotum. Musculoskeletal:        General: No tenderness.     Cervical back: Neck supple.  Skin:    General: Skin is warm.     Capillary Refill: Capillary refill takes less than 2 seconds.  Neurological:     Mental Status: He is alert.     ED Results / Procedures / Treatments   Labs (all labs ordered are listed, but only abnormal results are displayed) Labs Reviewed  COMPREHENSIVE METABOLIC PANEL - Abnormal; Notable for the following components:      Result Value   Glucose, Bld 105 (*)    AST 109 (*)    ALT 156 (*)    All other components within normal limits  CBC WITH DIFFERENTIAL/PLATELET  AMMONIA  AFP TUMOR MARKER    EKG None  Radiology No results found.  Procedures Procedures (including critical care time)  Medications Ordered in ED Medications  HYDROmorphone (DILAUDID) injection 0.5 mg (0.5 mg Intravenous Given 12/27/20 2253)  ondansetron (ZOFRAN) injection 4 mg (4 mg Intravenous Given 12/27/20 2253)    ED Course  I have reviewed the triage vital signs and the nursing notes.  Pertinent labs & imaging results that were available during my care of the patient were reviewed by me and considered in my medical decision making (see chart for details).    MDM Rules/Calculators/A&P                          Patient presents with right groin mass.  Previously seen for right groin pain but had not found pathology previously.  However today there is an inguinal hernia there.  Tender but not discolored.  Does not appear to be obstructed.  Does have known hepatic cancer.  Also it appears that palliative care is going to be consulting with the patient.  We will have surgery follow-up since the hernia self reduced after some pain medicine.  Discharge home. Lab  work has been collected that he had missed that had been ordered by oncology. Final Clinical Impression(s) / ED Diagnoses Final diagnoses:  Right inguinal hernia    Rx / DC Orders ED Discharge Orders    None       Davonna Belling, MD 12/27/20 2336

## 2020-12-28 ENCOUNTER — Inpatient Hospital Stay: Payer: Medicaid Other

## 2020-12-29 ENCOUNTER — Encounter: Payer: Self-pay | Admitting: General Practice

## 2020-12-29 LAB — AFP TUMOR MARKER: AFP, Serum, Tumor Marker: 30.4 ng/mL — ABNORMAL HIGH (ref 0.0–8.3)

## 2020-12-29 NOTE — Progress Notes (Signed)
[facility] CSW Progress Notes  Call from daughter, Orlie Cundari.  Patient is 5 months behind in rent, daughter has applied for rental assistance from [place].  Landlord is requiring payment of January rent even though referral has been sent to [facility].  Patient is in full control of his finances, no powers of attorney in place.  As patient is not in active treatment at this time, he is not eligible for [employer] through [facility].  Provided daughter with contact information for [employer] Emergency Assistance program, [employer] and [employer] - all of which may have funds to assist.  Daughter also relates that she hopes he will be placed into "some kind of facility because he cannot live on his own."  Daughter "had to put out a [place] for him because he wandered off and we didn't know where he was."  Also has left stove on, is not eating properly, is not taking medications regularly.  Lives alone, very limited family/friend support.  Daughter hopes tomorrow's Palliative Care consult in the home will provide some direction.  Given daughters concerns for patient's safety, advised that she can contact DSS Adult Protective Services and enlist their help with making plan for patient.  Edwyna Shell, LCSW Clinical Social Worker Phone:  [phone]

## 2020-12-30 ENCOUNTER — Other Ambulatory Visit: Payer: Self-pay

## 2020-12-30 ENCOUNTER — Telehealth: Payer: Self-pay | Admitting: Nutrition

## 2020-12-30 ENCOUNTER — Other Ambulatory Visit: Payer: Medicaid Other | Admitting: Internal Medicine

## 2020-12-30 DIAGNOSIS — Z7189 Other specified counseling: Secondary | ICD-10-CM

## 2020-12-30 DIAGNOSIS — F0151 Vascular dementia with behavioral disturbance: Secondary | ICD-10-CM

## 2020-12-30 DIAGNOSIS — Z515 Encounter for palliative care: Secondary | ICD-10-CM

## 2020-12-30 DIAGNOSIS — F01518 Vascular dementia, unspecified severity, with other behavioral disturbance: Secondary | ICD-10-CM

## 2020-12-30 DIAGNOSIS — Z659 Problem related to unspecified psychosocial circumstances: Secondary | ICD-10-CM

## 2020-12-30 DIAGNOSIS — C220 Liver cell carcinoma: Secondary | ICD-10-CM

## 2020-12-30 NOTE — Telephone Encounter (Signed)
Contact patient's daughter to verify telephone visit for pre reg

## 2021-01-02 ENCOUNTER — Inpatient Hospital Stay: Payer: Medicaid Other | Admitting: Nutrition

## 2021-01-02 ENCOUNTER — Telehealth: Payer: Self-pay | Admitting: Nutrition

## 2021-01-02 ENCOUNTER — Ambulatory Visit: Payer: Medicaid Other | Admitting: Hematology

## 2021-01-02 NOTE — Telephone Encounter (Signed)
I contacted patient daughter by telephone.  There was no answer.  I was able to leave a message with my name and phone number for return call.

## 2021-01-03 ENCOUNTER — Telehealth: Payer: Self-pay

## 2021-01-03 NOTE — Telephone Encounter (Signed)
EC/ family member sharing information

## 2021-01-03 NOTE — Progress Notes (Addendum)
[facility] Consult Note Telephone: [phone]  Fax: [phone]  PATIENT NAME: Devin Myers DOB: 1956-04-01 MRN: [id number]  PRIMARY CARE PROVIDER: Dr. Magdalene River  REFERRING PROVIDER:  Trey Sailors, [place] [address] [place],  [place] [address]   Durene Fruits, NP  RESPONSIBLE PARTY:    Self     Emergency contacts:   Lucie Leather--   Daughter   [phone]  [facility] Daughter [phone]  [phone]        RECOMMENDATIONS and PLAN:  Palliative care encounter  Z 55.1  1.  Advance care planning:  Explanation of palliative and hospice care to patient and daughters.  Patient's goals are to live and have quality of life.  He plans on continuing appointments with oncologist to pursue any potential treatments.  [place] while he is able to enjoy both.  Discussed initiating a health care power of attorney to assist with decision making.  He expressed that he would like to have his daughter Devin Myers(lives in [place], [place]) to be this person.  He and his daughter will attempt to have this completed.  Advanced directives reviewed and a MOST form was completed, uploaded to Epic and left in patient's home for display.  His delegations were of attempt CPR, limited additional interventions, antibiotics if indicated, trial IV fluids.  He is unsure of a decision related to tube feedings.   2.  Hepatocellular carcinoma:  Newly diagnosed.  No current treatment.  Keep pending appt on 01/05/21 for a PET scan and follow-up with Dr. Burr Medico for additional planning.  3.  Vascular dementia:  2020 brain CT scan notes chronic microvascular ischemia with previous infarcts. FAST stage 6b.  He needs home assistance with preparation of meals and medications.  Very forgetful.  Consider neuro evaluation for potential treatment of dementia.  4.  Social problems:  Pt. Has high need for assistance with current and  future planning for delivery of care, financial assistance with bill paying, safe delivery of meals, prevention of wandering, health care POA and a medication administration delivery system( unknown location of meds except analgesic).  His daughter Devin Myers, that he would allow to assist with these concerns, lives out of state in [place] , [place].  Encouraged both daughters to assist patient as best as possible with providing and planning for his needs. Pt's dementia complicates all of these levels of concern. Social worker referral at [facility] to attempt to coordinate care with DSS and cancer center.    5. Right groin pain/hernia:  Keep pending appointment for PET scan and f/u with Dr. Burr Medico.  Avoid heavy lifting and straining.  I spent 90 minutes providing this consultation,  From 0900  To 1030 . More than 50% of the time in this consultation was spent coordinating communication with patient, daughters [place] and Devin Myers(via phone).   HISTORY OF PRESENT ILLNESS:  Devin Myers is a 65 y.o. year old male with multiple medical problems including newly diagnosed stage IV liver cancer, dementia and a R inguinal hernia with pain. Daughters report that patient has had increased difficulties caring for himself safely at his home due to forgetfulness. He has been undergoing evaluations to determine the best treatment plan for above diagnoses.  Palliative Care was asked to help address goals of care.   CODE STATUS: Attempt CPR, no intubation  PPS: 60% HOSPICE ELIGIBILITY/DIAGNOSIS: TBD  PAST MEDICAL HISTORY:  Past Medical History:  Diagnosis Date  .  Anxiety    Phreesia 01/01/2021  . COPD (chronic obstructive pulmonary disease) ([place])   . Dementia ([place])   . Depression   . Diabetes mellitus without complication ([place])   . Hypertension     SOCIAL HX:  Lives alone; 3 adult daughters Social History   Tobacco Use  . Smoking status: Current Some Day Smoker    Packs/day: 0.25    Years: 40.00    Pack years: 10.00     Types: Cigarettes  . Smokeless tobacco: Never Used  Substance Use Topics  . Alcohol use: Yes    Comment: occasionally    ALLERGIES:  Allergies  Allergen Reactions  . Codeine Nausea And Vomiting     PERTINENT MEDICATIONS:  Outpatient Encounter Medications as of 12/30/2020  Medication Sig  . albuterol (VENTOLIN HFA) 108 (90 Base) MCG/ACT inhaler Inhale 2 puffs into the lungs every 6 (six) hours as needed for wheezing or shortness of breath.  Marland Kitchen atorvastatin (LIPITOR) 20 MG tablet Take 1 tablet (20 mg total) by mouth daily at 6 PM.  . DULoxetine (CYMBALTA) 60 MG capsule Take 1 capsule (60 mg total) by mouth daily.  Marland Kitchen HYDROcodone-acetaminophen (NORCO/VICODIN) 5-325 MG tablet Take 1 tablet by mouth every 6 (six) hours as needed.  Marland Kitchen lisinopril (ZESTRIL) 20 MG tablet Take 1 tablet (20 mg total) by mouth daily.  . ondansetron (ZOFRAN) 4 MG tablet Take 1 tablet (4 mg total) by mouth every 6 (six) hours as needed for nausea. (Patient not taking: No sig reported)  . Spacer/Aero-Holding Chambers (AEROCHAMBER PLUS WITH MASK) inhaler Use with inhaler (Patient not taking: No sig reported)  . SYMBICORT 80-4.5 MCG/ACT inhaler Inhale 2 puffs into the lungs in the morning and at bedtime.  . tamsulosin (FLOMAX) 0.4 MG CAPS capsule Take 1 capsule (0.4 mg total) by mouth daily.  Marland Kitchen umeclidinium bromide (INCRUSE ELLIPTA) 62.5 MCG/INH AEPB Inhale 1 puff into the lungs daily.   No facility-administered encounter medications on file as of 12/30/2020.    PHYSICAL EXAM:   General: No acute physical distress, frail appearing, thin Cardiovascular: regular rate and rhythm Pulmonary: clear throughout Abdomen: soft, + bowel sounds, tenderness R groin Extremities: no edema, no joint deformities Skin: exposed skin is intact Neurological: Alert and oriented x 3,  Repetitive conversation, difficult to maintain focus of conversation Psych:  Initially very agitated mood related to visiting daughter [place].  Mood returned  to intermittently tearful and calm after discussions related to health  Gonzella Lex, NP-C

## 2021-01-03 NOTE — Progress Notes (Signed)
Subjective:    Devin Myers - 65 y.o. male MRN [id number]  Date of birth: [birth year]  HPI  Devin Myers is to establish care. Patient has a PMH significant for hepatocellular carcinoma, adjustment disorder with disturbance of conduct, chest pain, shortness of breath, depression, and anxiety. Patient presents to the appointment unaccompanied.  Current issues and/or concerns:  1. HYPERTENSION: 08/12/2019: Visit with physician assistant Devin Myers. Continued on Lisinopril.  01/04/2021: Today reports he is unsure of the name of the blood pressure medication he takes and does not remember the last time he took the medication.  2. DIABETES TYPE 2: Last A1C:  6.3% about 1 year ago. Reports he does not know the name of the diabetes medication he takes and does not remember the last time he took the medication.  3. COPD: Patient states he has two inhalers at home which he uses only if he needs it. States he does not know the names of the inhalers.  4. HEPATOCELLULAR CARCINOMA per MD note on 12/19/2020: -At 09/09/19 ED visit he was found to have Liver cirrhosis and multiple liver mass in left lobe concerning for multifocal liver cancer on CT. Pt did not follow up as recommended.  -After over a year of intermittent abdominal pain his pain increased recently. His CT AP form 11/11/20 showed increased progression of liver mass in left lobe (12.7cm on 12/09/20 CT AP) and Enlarging precordial, portacaval and celiac axis lymph nodes which could reflect metastasis. No other evidence of distant metastatic disease or acute findings. I reviewed the images myself  -His 12/13/20 Liver biopsy confirmed Well to moderately-differentiated hepatocellular carcinoma. I discussed with pt and his family and gave him a copy of the report  -I recommend PET scan to complete staging, and evaluate PR nodes status. He is agreeable.  -I discussed given the size of his liver mass and possible retroperitoneal LN involvement his  cancer is unlikely curable by surgery at this stage, and he is not a candidate for liver transplant. I will discuss his case in GI Tumor board to see if he is eligible for liver targeted therapy such as Y90. I discussed these options are palliative and prolong his life.  -I also discussed the option of systemic treatment with IV Immunotherapy and VGFR inhibitor or oral TKI such as Lenvima to control his disease and prolong his life. I discussed this has moderate efficiency. Side effects were reviewed with pt and his daughter in details  -His daughter is leaning toward oral treatment or Y90 to start. Will re-evaluate based on Tumor Board discussion.  -I discussed Hospice home care will be an option if he is declining past treatment care or no longer wants to proceed with treatment.  -F/u in 2 weeks.   5. LIVER CIRRHOSIS per MD note on 12/19/2020: -At 09/09/19 ED visit he was seen to have Liver cirrhosis and liver mass concerning for liver cancer on CT AP. Pt did not follow up as recommended.  -I discussed possible etiology such as fatty liver or Hepatitis. He denies history of IV drug use, but notes his oldest daughter has Hep C.  -Will obtain further testing for Hep C. I also recommend EGD in the next few months to evaluate for esophageal Varices.   6. ABDOMINAL PAIN per MD note on 12/19/2020: -After over a year of intermittent abdominal pain his pain increased recently. He notes upper and lower abdominal pain is now 10/10. His pain is likely secondary to his liver cancer  -Ibuprofen  800mg  is not controlling pain. He has hydrocodone but does not take it enough given dementia. I discussed this is a controlled substance and should have help managing it and watch his use as I will not refill sooner. His friend and daughter are agreeable.  -I refilled Hydrocodone up to q6hours today only at 2 weeks supply (12/20/19).   7. SOCIAL SUPPORT DEMENTIA per MD note on 12/19/2020: -He is out of work on Disability from  mental and physical ailments. He also has Medicaid. -He was recently diagnosed with early stage dementia in 2021. He has moderate memory of history but will often forget medications, etc.  -He currently lives alone. He has struggles with ADLs and cooking given his dementia and that has effected his weight.  -He is divorced and has 3 adult daughters, 53 ([place]) which lives in town. He has a close friend [place] in town as well. He overall has moderate social support, but given dementia needs more at home help due to decreased ability to do ADLs.  -I will refer him to palliative care service, [facility], but this does not cover personal care. I will refer him to our social worker and dietician.  -He is anxious and emotional about his diagnosis and concern for prognosis. SW counseling  8. RIGHT GROIN PAIN/HERNIA per NP note on 12/30/2020: Keep pending appointment for PET scan and f/u with Dr. Burr Myers.  Avoid heavy lifting and straining.  ROS per HPI   Health Maintenance:  Health Maintenance Due  Topic Date Due   Hepatitis C Screening  Never done   COVID-19 Vaccine (1) Never done   TETANUS/TDAP  Never done   COLONOSCOPY (Pts 45-69yrs Insurance coverage will need to be confirmed)  Never done   INFLUENZA VACCINE  Never done   Past Medical History: Patient Active Problem List   Diagnosis Date Noted   Hepatocellular carcinoma ([place]) 12/19/2020   Chest pain 03/12/2019   Shortness of breath    Depression    Anxiety    Adjustment disorder with disturbance of conduct 10/26/2018    Social History   reports that he has been smoking cigarettes. He has a 10.00 pack-year smoking history. He has never used smokeless tobacco. He reports current alcohol use. He reports current drug use. Drug: Marijuana.   Family History  family history includes Inflammatory bowel disease in his daughter.   Medications: reviewed and updated   Objective:   Physical Exam BP (!) 160/100 (BP Location: Left  Arm, Patient Position: Sitting)    Pulse 83    Ht 5' 10.98" (1.803 m)    Wt 144 lb 12.8 oz (65.7 kg)    SpO2 98%    BMI 20.20 kg/m  Physical Exam HENT:     Head: Normocephalic.  Eyes:     Extraocular Movements: Extraocular movements intact.     Pupils: Pupils are equal, round, and reactive to light.  Cardiovascular:     Rate and Rhythm: Normal rate and regular rhythm.     Pulses: Normal pulses.     Heart sounds: Normal heart sounds.  Pulmonary:     Effort: Pulmonary effort is normal.     Breath sounds: Normal breath sounds.  Musculoskeletal:     Cervical back: Normal range of motion and neck supple.  Neurological:     Mental Status: He is alert. He is disoriented.  Psychiatric:        Mood and Affect: Mood normal.       Assessment & Plan:  1. Encounter to establish care: - Patient presents today to establish care.  - Return in 4 to 6 weeks or sooner if needed for annual physical examination, labs, and health maintenance.   2. Essential hypertension: - Blood pressure not at goal during today's visit. Patient asymptomatic without chest pressure, chest pain, palpitations, shortness of breath, and headache. - Patient reports he is unsure of the name of the blood pressure medication he takes and does not remember the last time he took the medication. - Patient does have hepatocellular carcinoma and receiving care with Oncology. - Chart indicates history of taking Lisinopril in the past. - Will begin low-dose Lisinopril as prescribed for high blood pressure. - Follow-up in 1 week with clinical pharmacist for blood pressure check. Medications may be adjusted at that time if needed. - CMP last obtained 12/27/2020. - CBC last obtained 12/27/2020. - Follow-up with primary provider as scheduled. - lisinopril (ZESTRIL) 10 MG tablet; Take 1 tablet (10 mg total) by mouth daily.  Dispense: 45 tablet; Refill: 0  3. Type 2 diabetes mellitus without complication, without long-term current use of  insulin ([place]): - Patient reports he does not know the name of the diabetes medication he takes and does not remember the last time he took the medication. - CBG 123 today in clinic. - Hemoglobin A1c at goal today at 6.1%, goal < 7%. This is slightly improved from previous hemoglobin A1c 6.3% one year ago. - Patient does have hepatocellular carcinoma and receiving care with Oncology. - Referral to Endocrinology for further evaluation and management. - Follow-up with primary provider as scheduled. - Glucose (CBG) - POCT glycosylated hemoglobin (Hb A1C) - Ambulatory referral to Endocrinology  4. Chronic obstructive pulmonary disease, unspecified COPD type ([place]): - Patient states he has two inhalers at home which he uses only if he needs it. States he does not know the names of the inhalers. - Chart indicates history of taking Albuterol inhaler and Symbicort inhaler. - Albuterol inhaler and Symbicort inhaler as prescribed.  - Patient does have hepatocellular carcinoma and receiving care with Oncology. - Referral to Pulmonology for further evaluation and management.  - Follow-up with primary provider as scheduled. - albuterol (VENTOLIN HFA) 108 (90 Base) MCG/ACT inhaler; Inhale 2 puffs into the lungs every 6 (six) hours as needed for wheezing or shortness of breath.  Dispense: 18 g; Refill: 1 - SYMBICORT 80-4.5 MCG/ACT inhaler; Inhale 2 puffs into the lungs in the morning and at bedtime.  Dispense: 1 each; Refill: 2 - Ambulatory referral to Pulmonology  5. Hepatocellular carcinoma [facility]): - Keep all appointments with Oncology.  6. Hepatic cirrhosis, unspecified hepatic cirrhosis type, unspecified whether ascites present [facility]): - Keep all appointments with Oncology.   7. Right inguinal hernia: - Patient consulted with palliative care on 12/30/2020. Plan for PET scan and follow-up with Oncology. Avoid heavy lifting and straining. - Keep all appointments with Oncology.  8. Vascular dementia  without behavioral disturbance [facility]): - Patient presents today in clinic alone. Was not able to get pertinent history from patient in regards of medications and chronic diseases.  - Patient is receiving care with [facility] for assistance and referrals as indicated.  Durene Fruits, NP 01/04/2021, 11:02 PM Primary Care at [facility]

## 2021-01-04 ENCOUNTER — Encounter: Payer: Self-pay | Admitting: General Practice

## 2021-01-04 ENCOUNTER — Ambulatory Visit (INDEPENDENT_AMBULATORY_CARE_PROVIDER_SITE_OTHER): Payer: Medicaid Other | Admitting: Family

## 2021-01-04 ENCOUNTER — Other Ambulatory Visit: Payer: Self-pay

## 2021-01-04 ENCOUNTER — Encounter: Payer: Self-pay | Admitting: Family

## 2021-01-04 VITALS — BP 160/100 | HR 83 | Ht 70.98 in | Wt 144.8 lb

## 2021-01-04 DIAGNOSIS — C220 Liver cell carcinoma: Secondary | ICD-10-CM

## 2021-01-04 DIAGNOSIS — K4090 Unilateral inguinal hernia, without obstruction or gangrene, not specified as recurrent: Secondary | ICD-10-CM

## 2021-01-04 DIAGNOSIS — F0150 Vascular dementia without behavioral disturbance: Secondary | ICD-10-CM

## 2021-01-04 DIAGNOSIS — J449 Chronic obstructive pulmonary disease, unspecified: Secondary | ICD-10-CM | POA: Diagnosis not present

## 2021-01-04 DIAGNOSIS — E119 Type 2 diabetes mellitus without complications: Secondary | ICD-10-CM

## 2021-01-04 DIAGNOSIS — K7460 Unspecified cirrhosis of liver: Secondary | ICD-10-CM

## 2021-01-04 DIAGNOSIS — I10 Essential (primary) hypertension: Secondary | ICD-10-CM

## 2021-01-04 DIAGNOSIS — Z7689 Persons encountering health services in other specified circumstances: Secondary | ICD-10-CM

## 2021-01-04 LAB — POCT GLYCOSYLATED HEMOGLOBIN (HGB A1C): Hemoglobin A1C: 6.1 % — AB (ref 4.0–5.6)

## 2021-01-04 LAB — GLUCOSE, POCT (MANUAL RESULT ENTRY): POC Glucose: 123 mg/dl — AB (ref 70–99)

## 2021-01-04 MED ORDER — ALBUTEROL SULFATE HFA 108 (90 BASE) MCG/ACT IN AERS: 2.0000 | INHALATION_SPRAY | Freq: Four times a day (QID) | RESPIRATORY_TRACT | 1 refills | Status: AC | PRN

## 2021-01-04 MED ORDER — LISINOPRIL 10 MG PO TABS: 10.0000 mg | ORAL_TABLET | Freq: Every day | ORAL | 0 refills | Status: AC

## 2021-01-04 MED ORDER — SYMBICORT 80-4.5 MCG/ACT IN AERO
2.0000 | INHALATION_SPRAY | Freq: Two times a day (BID) | RESPIRATORY_TRACT | 2 refills | Status: AC
Start: 2021-01-04 — End: 2021-04-04

## 2021-01-04 NOTE — Progress Notes (Signed)
[place] CSW Progress Notes  [employer] [facility] [place] to track referral for [facility]  Per [place], they received form from Santiago Glad B on 12/23/20, finished processing it on 12/26/20.  Per Janeece Riggers, practitioner signature date was read as "12/23/2018" which is invalid - they request the date on form be corrected and the form be resubmitted by fax to  [phone].  RN advised of this request.  Edwyna Shell, LCSW Clinical Social Worker Phone:  [phone]

## 2021-01-04 NOTE — Progress Notes (Signed)
Establish care Extreme groin pain due to Hepatocellular carcinoma

## 2021-01-04 NOTE — Progress Notes (Signed)
[place]   Telephone:([phone]) [phone] Fax:(336) [phone]   Clinic Follow up Note   Patient Care Team: Patient, No Pcp Per as PCP - General (General Practice) Jonnie Finner, RN as Oncology Nurse Navigator Truitt Merle, MD as Consulting Physician (Oncology) Loletha Carrow Kirke Corin, MD as Consulting Physician (Gastroenterology)  Date of Service:  01/06/2021  CHIEF COMPLAINT: F/u of liver cancer   SUMMARY OF ONCOLOGIC HISTORY: Oncology History Overview Note  Cancer Staging Hepatocellular carcinoma [facility]) Staging form: Liver, AJCC 8th Edition - Clinical stage from 12/13/2020: Stage IVA (cT3, cN1, cM0) - Signed by Truitt Merle, MD on 12/19/2020    Hepatocellular carcinoma ([place])  11/11/2020 Imaging   CT AP 11/11/20  IMPRESSION: 1. Significant interval progression of previously demonstrated enhancing mass in the left hepatic lobe consistent with progressive hepatocellular carcinoma. There is now near complete replacement of the left hepatic lobe, with possible tumor thrombus in the left portal and left hepatic veins. 2. Stable mildly enlarged gastrohepatic ligament lymph nodes, nonspecific and possibly related to underlying cirrhosis. Enlarging precordial lymph node could reflect a metastasis. 3. No other evidence of metastatic disease or acute findings. 4. Aortic Atherosclerosis (ICD10-I70.0).   11/11/2020 Tumor Marker   AFP 27.4   12/09/2020 Imaging   CT AP 12/09/20 IMPRESSION: 1. Examination is generally somewhat limited by patient arm positioning over the central abdomen with metallic streak artifact related to rings and jewelry. 2. Within this limitation, no acute CT findings of the abdomen or pelvis to explain right lower quadrant abdominal pain. Normal appendix. 3. Redemonstrated, multilobulated heterogeneously enhancing mass replacing essentially the entire left lobe of the liver, measuring approximately 12.7 x 7.0 cm in axial dimension, not  significantly changed compared to prior CT and consistent with hepatocellular carcinoma. 4. Redemonstrated enlarged portacaval and celiac axis lymph nodes measuring up to 1.7 x 1.0 cm. Aortic Atherosclerosis (ICD10-I70.0).   12/13/2020 Cancer Staging   Staging form: Liver, AJCC 8th Edition - Clinical stage from 12/13/2020: Stage IVA (cT3, cN1, cM0) - Signed by Truitt Merle, MD on 12/19/2020   12/13/2020 Initial Biopsy   FINAL MICROSCOPIC DIAGNOSIS: 12/13/20  A. LIVER, LEFT LOBE, BIOPSY:  - Well to moderately-differentiated hepatocellular carcinoma, see  comment  COMMENT:  Dr. Saralyn Pilar reviewed the case and concurs with the diagnosis.  Dr. Loletha Carrow  was notified on 12/14/2020.    12/19/2020 Initial Diagnosis   Hepatocellular carcinoma ([place])   01/05/2021 PET scan   IMPRESSION: 1. Large infiltrating left hepatic lobe lesion is hypermetabolic and consistent with known hepatocellular cancer. 2. Small adjacent hypermetabolic lymph nodes. No findings for distant metastatic disease. 3. Mild emphysematous changes and three-vessel coronary artery calcifications.      CURRENT THERAPY:  Pending treatment   INTERVAL HISTORY:  Devin Myers is here for a follow up. He presents to the clinic with his son-in-law given his friend is in the ED currently. I called daughter Devin Myers to be included in visit today. He notes he is stable. He notes he still has lower abdominal pain is intermittent, 10/110. He uses Norco as the pain starts not on a schedule. His daughter notes she is not able to see him daily. His other daughter who is a Product manager plans to have him move to [place] where she lives. He is not sure when this will happen. He notes he still has "knot" in right groin which he was told was a hernia. He notes he will push it back in when it protrudes.  His daughter  has requested personal care assistant through his PCP.     REVIEW OF SYSTEMS:   Constitutional: Denies fevers, chills or abnormal weight  loss Eyes: Denies blurriness of vision Ears, nose, mouth, throat, and face: Denies mucositis or sore throat Respiratory: Denies cough, dyspnea or wheezes Cardiovascular: Denies palpitation, chest discomfort or lower extremity swelling Gastrointestinal:  Denies nausea, heartburn or change in bowel habits Skin: Denies abnormal skin rashes Lymphatics: Denies new lymphadenopathy or easy bruising Neurological:Denies numbness, tingling or new weaknesses Behavioral/Psych: Mood is stable, no new changes  All other systems were reviewed with the patient and are negative.  MEDICAL HISTORY:  Past Medical History:  Diagnosis Date  . Anxiety    Phreesia 01/01/2021  . Cancer ([place])   . COPD (chronic obstructive pulmonary disease) ([place])   . Dementia ([place])   . Depression   . Diabetes mellitus without complication ([place])   . Hypertension     SURGICAL HISTORY: No past surgical history on file.  I have reviewed the social history and family history with the patient and they are unchanged from previous note.  ALLERGIES:  is allergic to codeine.  MEDICATIONS:  Current Outpatient Medications  Medication Sig Dispense Refill  . albuterol (VENTOLIN HFA) 108 (90 Base) MCG/ACT inhaler Inhale 2 puffs into the lungs every 6 (six) hours as needed for wheezing or shortness of breath. 18 g 1  . atorvastatin (LIPITOR) 20 MG tablet Take 1 tablet (20 mg total) by mouth daily at 6 PM. 30 tablet 3  . DULoxetine (CYMBALTA) 60 MG capsule Take 1 capsule (60 mg total) by mouth daily. 30 capsule 1  . HYDROcodone-acetaminophen (NORCO/VICODIN) 5-325 MG tablet Take 1 tablet by mouth every 6 (six) hours as needed. 64 tablet 0  . lisinopril (ZESTRIL) 10 MG tablet Take 1 tablet (10 mg total) by mouth daily. 45 tablet 0  . SYMBICORT 80-4.5 MCG/ACT inhaler Inhale 2 puffs into the lungs in the morning and at bedtime. 1 each 2  . tamsulosin (FLOMAX) 0.4 MG CAPS capsule Take 1 capsule (0.4 mg total) by mouth daily. 30 capsule 0   . umeclidinium bromide (INCRUSE ELLIPTA) 62.5 MCG/INH AEPB Inhale 1 puff into the lungs daily. 30 each 0   No current facility-administered medications for this visit.    PHYSICAL EXAMINATION: ECOG PERFORMANCE STATUS: 2 - Symptomatic, <50% confined to bed  Vitals:   01/06/21 1347  BP: 106/82  Pulse: 77  Resp: 18  Temp: (!) 97.5 F (36.4 C)  SpO2: 97%   Filed Weights   01/06/21 1347  Weight: 140 lb 12.8 oz (63.9 kg)    Due to COVID19 we will limit examination to appearance. Patient had no complaints.  GENERAL:alert, no distress and comfortable SKIN: skin color normal, no rashes or significant lesions EYES: normal, Conjunctiva are pink and non-injected, sclera clear  NEURO: alert & oriented x 3 with fluent speech   LABORATORY DATA:  I have reviewed the data as listed CBC Latest Ref Rng & Units 01/06/2021 12/27/2020 12/15/2020  WBC 4.0 - 10.5 K/uL 4.3 4.8 6.1  Hemoglobin 13.0 - 17.0 g/dL 14.0 14.0 15.6  Hematocrit 39.0 - 52.0 % 41.8 43.2 45.8  Platelets 150 - 400 K/uL 226 233 191     CMP Latest Ref Rng & Units 01/06/2021 12/27/2020 12/15/2020  Glucose 70 - 99 mg/dL 177(H) 105(H) 106(H)  BUN 8 - 23 mg/dL 15 13 22   Creatinine 0.61 - 1.24 mg/dL 0.93 0.84 1.12  Sodium 135 - 145 mmol/L 140 141 141  Potassium 3.5 - 5.1 mmol/L 3.8 3.9 4.1  Chloride 98 - 111 mmol/L 109 103 105  CO2 22 - 32 mmol/L 23 28 26   Calcium 8.9 - 10.3 mg/dL 9.1 9.3 9.9  Total Protein 6.5 - 8.1 g/dL 8.0 7.3 -  Total Bilirubin 0.3 - 1.2 mg/dL 0.6 0.6 -  Alkaline Phos 38 - 126 U/L 97 79 -  AST 15 - 41 U/L 96(H) 109(H) -  ALT 0 - 44 U/L 169(H) 156(H) -      RADIOGRAPHIC STUDIES: I have personally reviewed the radiological images as listed and agreed with the findings in the report. NM PET Image Initial (PI) Skull Base To Thigh  Result Date: 01/05/2021 CLINICAL DATA:  Initial treatment strategy for hepatocellular carcinoma. EXAM: NUCLEAR MEDICINE PET SKULL BASE TO THIGH TECHNIQUE: 7.02 mCi F-18 FDG  was injected intravenously. Full-ring PET imaging was performed from the skull base to thigh after the radiotracer. CT data was obtained and used for attenuation correction and anatomic localization. Fasting blood glucose: 125 mg/dl COMPARISON:  CT scans from September 2020 and 12/09/2020 FINDINGS: Mediastinal blood pool activity: SUV max 2.17 Liver activity: SUV max NA NECK: No hypermetabolic lymph nodes in the neck. Incidental CT findings: none CHEST: No hypermetabolic mediastinal or hilar nodes. No suspicious pulmonary nodules on the CT scan. Incidental CT findings: Stable vascular calcifications including three-vessel coronary artery calcifications. ABDOMEN/PELVIS: Cirrhotic appearing changes involving the liver. Heterogeneous infiltrating lesion involving most of the left hepatic lobe is mildly hypermetabolic with SUV max of 7.84. Small lymph nodes noted along the posterior margin of the left hepatic lobe. These measure a maximum of 7.5 mm and SUV max is 3.64. Epicardial lymph node measures 8 mm and is mildly hypermetabolic with SUV max of 6.96. Incidental CT findings: Stable vascular calcifications. Small periumbilical abdominal wall hernia containing fat. SKELETON: No significant bony findings.  Heel Incidental CT findings: none IMPRESSION: 1. Large infiltrating left hepatic lobe lesion is hypermetabolic and consistent with known hepatocellular cancer. 2. Small adjacent hypermetabolic lymph nodes. No findings for distant metastatic disease. 3. Mild emphysematous changes and three-vessel coronary artery calcifications. Electronically Signed   By: Marijo Sanes M.D.   On: 01/05/2021 14:11     ASSESSMENT & PLAN:  Quashawn Jewkes is a 65 y.o. male with    1. Hepatocellular carcinoma, cT3N1M0  -At 09/09/19 ED visit he was found to have Liver cirrhosis and multiple liver mass in left lobe concerning for multifocal liver cancer on CT. Pt did not follow up as recommended.  -After over a year of intermittent  abdominal pain his pain increased recently. His CT AP form 11/11/20 showed increased progression of liver mass in left lobe (12.7cm on 12/09/20 CT AP) and Enlarging precordial, portacaval and celiac axis lymph nodes which could reflect metastasis. -His 12/13/20 Liver biopsy confirmed Well to moderately-differentiated hepatocellular carcinoma. -I personally reviewed and discussed his PET from 01/05/21 with pt and his daughter which showed known liver mass and mildly hypermetabolic adjacent regional lymph nodes. No findings for distant metastatic disease. -I discussed given the size of his liver mass his cancer is unlikely curable by surgery at this stage, and he is not a candidate for liver transplant. I discussed his case in recent GI Tumor board and IR Dr Kathlene Cote does not feel he will tolerate Y90, given his poor tolerance to liver biopsy.  -I recommend him to try oral TKI such as Lenvatinib or IV immunotherapy w or wo Bevacizumab, if he has good social support  -  Pt is likely to move to [place] to be with his daughter who is an Product manager. I will not starting systemic treatment options here  -F/u open.    2. Liver Cirrhosis, unknown etiology, Child-Pugh class A -At 09/09/19 ED visit he was seen to have Liver cirrhosis and liver mass concerning for liver cancer on CT AP. Pt did not follow up as recommended.  -I discussed possible etiology such as fatty liver or Hepatitis. He denies history of IV drug use, nut notes his oldest daughter has Hep C.  -Will obtain further testing for Hep C. I also recommend EGD in the next few months to evaluate for esophageal Varices.   3. Abdominal pain  -After over a year of intermittent abdominal pain his pain increased recently. He notes upper and lower abdominal pain is now 10/10. His pain is likely secondary to his liver cancer  -For pain he is currently on Norco which he uses as needed. However he is not able to tell me who he has been taking it (doses and  frequency). Given his cognitive dysfunction, I discussed with him and his daughter about his daughter taking over his pain medication to manage his use and help control his pain. She is agreeable. I will refill him once his daughter updates me on how many pills he has remaining.   4. Comorbidities: Depression, COPD, HTN, DM, HLD -On Lisinopril, Cymbalta, Lipitor, Inhaler, Flomax  5. Social Support, Dementia  -He is out of work on Disability from mental and physical ailments. He also has medicaid. -He has some memory loss, and often forgets medications, etc.  -He currently lives alone. He has struggles with ADLs and cooking given his dementia and that has effected his weight.  -He is divorced and has 4 adult daughters, 75 ([place]) which lives in town. He has a close friend [place] in town as well. He overall has moderate social support, but given dementia needs more at home help due to decreased ability to do ADLs.  -Our social worker Webb Silversmith is going to talk to him about his home care service  -His daughter Devin Myers requested personal care assistant through his PCP. He also may move to [place] with his other daughter who is a oncology nurse, I will refer if he opts to proceed.    PLAN:  -Pt is moving to [place] iwll refill Norco if needed when her daughter calls back  -F/u open    No problem-specific Assessment & Plan notes found for this encounter.   No orders of the defined types were placed in this encounter.  All questions were answered. The patient knows to call the clinic with any problems, questions or concerns. No barriers to learning was detected. The total time spent in the appointment was 30 minutes.     Truitt Merle, MD 01/06/2021   I, Joslyn Devon, am acting as scribe for Truitt Merle, MD.   I have reviewed the above documentation for accuracy and completeness, and I agree with the above.

## 2021-01-04 NOTE — Patient Instructions (Addendum)
Return in 4 to 6 weeks or sooner if needed for annual physical examination, labs, and health maintenance. Arrive fasting meaning having had no food and/or nothing to drink for at least 8 hours prior to appointment.  Lisinopril for high blood pressure. Follow-up in 1 week for blood pressure check with clinical pharmacist at [facility] [place] [phone].  Referral to Endocrinology for diabetes management.  Continue Albuterol inhaler and Symbicort inhaler for COPD.  Keep appointments with Oncology.  Thank you for choosing Primary [facility] at [facility] for your medical home!    Devin Myers was seen by Devin Herter, NP today.   Devin Myers's primary care provider is Devin Finchum Zachery Dauer, NP.   For the best care possible,  you should try to see Devin Fruits, NP whenever you come to clinic.   We look forward to seeing you again soon!  If you have any questions about your visit today,  please call us at [phone]  Or feel free to reach your provider via [place].   Hypertension, Adult Hypertension is another name for high blood pressure. High blood pressure forces your heart to work harder to pump blood. This can cause problems over time. There are two numbers in a blood pressure reading. There is a top number (systolic) over a bottom number (diastolic). It is best to have a blood pressure that is below 120/80. Healthy choices can help lower your blood pressure, or you may need medicine to help lower it. What are the causes? The cause of this condition is not known. Some conditions may be related to high blood pressure. What increases the risk?  Smoking.  Having type 2 diabetes mellitus, high cholesterol, or both.  Not getting enough exercise or physical activity.  Being overweight.  Having too much fat, sugar, calories, or salt (sodium) in your diet.  Drinking too much alcohol.  Having long-term (chronic) kidney disease.  Having a family history of high blood  pressure.  Age. Risk increases with age.  Race. You may be at higher risk if you are African American.  Gender. Men are at higher risk than women before age 23. After age 15, women are at higher risk than men.  Having obstructive sleep apnea.  Stress. What are the signs or symptoms?  High blood pressure may not cause symptoms. Very high blood pressure (hypertensive crisis) may cause: ? Headache. ? Feelings of worry or nervousness (anxiety). ? Shortness of breath. ? Nosebleed. ? A feeling of being sick to your stomach (nausea). ? Throwing up (vomiting). ? Changes in how you see. ? Very bad chest pain. ? Seizures. How is this treated?  This condition is treated by making healthy lifestyle changes, such as: ? Eating healthy foods. ? Exercising more. ? Drinking less alcohol.  Your health care provider may prescribe medicine if lifestyle changes are not enough to get your blood pressure under control, and if: ? Your top number is above 130. ? Your bottom number is above 80.  Your personal target blood pressure may vary. Follow these instructions at home: Eating and drinking  If told, follow the DASH eating plan. To follow this plan: ? Fill one half of your plate at each meal with [place] and vegetables. ? Fill one fourth of your plate at each meal with whole grains. Whole grains include whole-wheat pasta, brown rice, and whole-grain bread. ? Eat or drink low-fat dairy products, such as skim milk or low-fat yogurt. ? Fill one  fourth of your plate at each meal with low-fat (lean) proteins. Low-fat proteins include fish, chicken without skin, eggs, beans, and tofu. ? Avoid fatty meat, cured and processed meat, or chicken with skin. ? Avoid pre-made or processed food.  Eat less than 1,500 mg of salt each day.  Do not drink alcohol if: ? Your doctor tells you not to drink. ? You are pregnant, may be pregnant, or are planning to become pregnant.  If you drink alcohol: ? Limit  how much you use to:  0-1 drink a day for women.  0-2 drinks a day for men. ? Be aware of how much alcohol is in your drink. In the [place] one drink equals one 12 oz bottle of beer (355 mL), one 5 oz glass of wine (148 mL), or one 1 oz glass of hard liquor (44 mL).   Lifestyle  Work with your doctor to stay at a healthy weight or to lose weight. Ask your doctor what the best weight is for you.  Get at least 30 minutes of exercise most days of the week. This may include walking, swimming, or biking.  Get at least 30 minutes of exercise that strengthens your muscles (resistance exercise) at least 3 days a week. This may include lifting weights or doing Pilates.  Do not use any products that contain nicotine or tobacco, such as cigarettes, e-cigarettes, and chewing tobacco. If you need help quitting, ask your doctor.  Check your blood pressure at home as told by your doctor.  Keep all follow-up visits as told by your doctor. This is important.   Medicines  Take over-the-counter and prescription medicines only as told by your doctor. Follow directions carefully.  Do not skip doses of blood pressure medicine. The medicine does not work as well if you skip doses. Skipping doses also puts you at risk for problems.  Ask your doctor about side effects or reactions to medicines that you should watch for. Contact a doctor if you:  Think you are having a reaction to the medicine you are taking.  Have headaches that keep coming back (recurring).  Feel dizzy.  Have swelling in your ankles.  Have trouble with your vision. Get help right away if you:  Get a very bad headache.  Start to feel mixed up (confused).  Feel weak or numb.  Feel faint.  Have very bad pain in your: ? Chest. ? Belly (abdomen).  Throw up more than once.  Have trouble breathing. Summary  Hypertension is another name for high blood pressure.  High blood pressure forces your heart to work harder to pump  blood.  For most people, a normal blood pressure is less than 120/80.  Making healthy choices can help lower blood pressure. If your blood pressure does not get lower with healthy choices, you may need to take medicine. This information is not intended to replace advice given to you by your health care provider. Make sure you discuss any questions you have with your health care provider. Document Revised: 08/13/2018 Document Reviewed: 08/13/2018 Elsevier Patient Education  2021 [employer].

## 2021-01-05 ENCOUNTER — Ambulatory Visit (HOSPITAL_COMMUNITY)
Admission: RE | Admit: 2021-01-05 | Discharge: 2021-01-05 | Disposition: A | Discharge status: Home / Self Care | Payer: Medicaid Other | Source: Ambulatory Visit | Attending: Hematology | Admitting: Hematology

## 2021-01-05 ENCOUNTER — Telehealth: Payer: Self-pay | Admitting: General Practice

## 2021-01-05 DIAGNOSIS — C220 Liver cell carcinoma: Secondary | ICD-10-CM | POA: Diagnosis not present

## 2021-01-05 LAB — GLUCOSE, CAPILLARY: Glucose-Capillary: 83 mg/dL (ref 70–99)

## 2021-01-05 MED ORDER — FLUDEOXYGLUCOSE F - 18 (FDG) INJECTION
7.0200 | Freq: Once | INTRAVENOUS | Status: AC
  Administered 2021-01-05: 7.02 via INTRAVENOUS

## 2021-01-05 NOTE — Telephone Encounter (Signed)
[place] CSW Progress Notes  Call to [employer] [facility] [place] to track referral resubmitted by [place] yesterday - per Janeece Riggers, they do not have it recorded in their system at present, but said to call back Monday or Tuesday as it can take 2 - 3 days to process/record documents.  CSW [place] asked to follow up early next week.  Edwyna Shell, LCSW Clinical Social Worker Phone:  [phone]

## 2021-01-06 ENCOUNTER — Inpatient Hospital Stay (HOSPITAL_BASED_OUTPATIENT_CLINIC_OR_DEPARTMENT_OTHER): Payer: Medicaid Other | Admitting: Hematology

## 2021-01-06 ENCOUNTER — Inpatient Hospital Stay: Payer: Medicaid Other | Admitting: General Practice

## 2021-01-06 ENCOUNTER — Other Ambulatory Visit: Payer: Self-pay

## 2021-01-06 ENCOUNTER — Telehealth: Payer: Self-pay | Admitting: General Practice

## 2021-01-06 ENCOUNTER — Inpatient Hospital Stay: Payer: Medicaid Other

## 2021-01-06 VITALS — BP 106/82 | HR 77 | Temp 97.5°F | Resp 18 | Ht 70.0 in | Wt 140.8 lb

## 2021-01-06 DIAGNOSIS — C220 Liver cell carcinoma: Secondary | ICD-10-CM

## 2021-01-06 LAB — CMP (CANCER CENTER ONLY)
ALT: 169 U/L — ABNORMAL HIGH (ref 0–44)
AST: 96 U/L — ABNORMAL HIGH (ref 15–41)
Albumin: 3.5 g/dL (ref 3.5–5.0)
Alkaline Phosphatase: 97 U/L (ref 38–126)
Anion gap: 8 (ref 5–15)
BUN: 15 mg/dL (ref 8–23)
CO2: 23 mmol/L (ref 22–32)
Calcium: 9.1 mg/dL (ref 8.9–10.3)
Chloride: 109 mmol/L (ref 98–111)
Creatinine: 0.93 mg/dL (ref 0.61–1.24)
GFR, Estimated: >60 mL/min (ref >60)
Glucose, Bld: 177 mg/dL — ABNORMAL HIGH (ref 70–99)
Potassium: 3.8 mmol/L (ref 3.5–5.1)
Sodium: 140 mmol/L (ref 135–145)
Total Bilirubin: 0.6 mg/dL (ref 0.3–1.2)
Total Protein: 8 g/dL (ref 6.5–8.1)

## 2021-01-06 LAB — CBC WITH DIFFERENTIAL (CANCER CENTER ONLY)
Abs Immature Granulocytes: 0.01 10*3/uL (ref 0.00–0.07)
Basophils Absolute: 0 10*3/uL (ref 0.0–0.1)
Basophils Relative: 1 %
Eosinophils Absolute: 0.1 10*3/uL (ref 0.0–0.5)
Eosinophils Relative: 3 %
HCT: 41.8 % (ref 39.0–52.0)
Hemoglobin: 14 g/dL (ref 13.0–17.0)
Immature Granulocytes: 0 %
Lymphocytes Relative: 35 %
Lymphs Abs: 1.5 10*3/uL (ref 0.7–4.0)
MCH: 28.7 pg (ref 26.0–34.0)
MCHC: 33.5 g/dL (ref 30.0–36.0)
MCV: 85.8 fL (ref 80.0–100.0)
Monocytes Absolute: 0.4 10*3/uL (ref 0.1–1.0)
Monocytes Relative: 9 %
Neutro Abs: 2.3 10*3/uL (ref 1.7–7.7)
Neutrophils Relative %: 52 %
Platelet Count: 226 10*3/uL (ref 150–400)
RBC: 4.87 MIL/uL (ref 4.22–5.81)
RDW: 13.9 % (ref 11.5–15.5)
WBC Count: 4.3 10*3/uL (ref 4.0–10.5)
nRBC: 0 % (ref 0.0–0.2)

## 2021-01-06 NOTE — Progress Notes (Signed)
[place] CSW Progress Notes  Met w patient in clinic.  He was accompanied by his son in law, patient asked son in law to exit room as he did not want to discuss his plans in front of him.  Clearly states he wants to be independent, live in his own home, have control of his money, not have anyone "interfering" with his choices.  He is quite difficult to follow - jumps from subject to subject and has difficulty identifying a clear plan of action with specific steps to accomplish it.  He is quite clear in his desire to remain independent and to return home to [place] which is familiar to him.    States he wants to move "home" to [place].  States he has multiple sisters who live in [place]    He identifies his daughter in [place] Charlena Cross) as his most trusted family member - CSW tried to call daughter in presence of patient but there was no answer, patient did not want CSW to leave her a VM.  He states he will get in touch w family members in [place] this weekend.  CSW tried to assess the strength of his plan to move out of state - he was unable to say where he would live or how he would travel there (other than to say he would rent a truck and trailer).  He wanted to know if he could keep his food Stamps and Medicaid.  CSW explained that these are state based resources which must be applied for again after he moves.  CSW explained that he can get access to [place] for some in home help, especially while he is here in [place].  He does not want placement in any kind of facility. He does admit that he needs help w cooking - "I burned my arm twice because it caught on first from the oven."  This is not a recent event, has no visible signs of this injury.  He is open to having some minimal help in the home.  Discussed need to solidify where he is living in order to plan treatment.  He states that his appearance is very important to him - "I love clothes and shoes, I want to look good, when will I  start losing weight?"  He expresses some fear about both his cancer and its treatment, remains uncertain about "what I have and what it will do to me."  Oncologist has explained this several times to patient, however he continues to express lack of understanding.  He does often exhibit signs of severe pain - states he wants to get home to take his pain medication.  He agrees to contact family in [place] this weekend and will let staff know where he intends to live so appropriate plans can be made.  He does not want son in law or local daughter involved in his decisions.    Edwyna Shell, LCSW Clinical Social Worker Phone:  [phone]

## 2021-01-06 NOTE — Telephone Encounter (Signed)
[place] again to check on status of [place] referral submitted multiple times Wednesday by [employer].  [facility] cannot confirm receipt, ask [place] to call back Monday as they may have processed referral by that time.  Edwyna Shell, LCSW Clinical Social Worker Phone:  [phone]

## 2021-01-07 LAB — AFP TUMOR MARKER: AFP, Serum, Tumor Marker: 32.6 ng/mL — ABNORMAL HIGH (ref 0.0–8.3)

## 2021-01-08 ENCOUNTER — Encounter: Payer: Self-pay | Admitting: Hematology

## 2021-01-09 ENCOUNTER — Telehealth: Payer: Self-pay | Admitting: Hematology

## 2021-01-09 ENCOUNTER — Encounter: Payer: Self-pay | Admitting: *Deleted

## 2021-01-09 NOTE — Progress Notes (Signed)
[place] Work  Clinical Social Work contacted [employer]  to confirm patients PCS referral was received.  [employer] reported referral had been received and patients assessment was scheduled for January 31st at 12:30.  CSW also confirmed that patients daughter [name] would be present for the assessment.  Johnnye Lana, MSW, LCSW, OSW-C Clinical Social Worker [facility] [phone]

## 2021-01-09 NOTE — Progress Notes (Unsigned)
   S:  PCP: Durene Fruits PMH: HTN, T2DM, hepatocellular carcinoma, depression, COPD, dementia  Patient arrives in good spirits.  Presents for diabetes evaluation, education, and management Patient was referred and last seen by Primary Care Provider on 01/04/21. At that visit, BP was elevated at 160/100 and pt reported he couldn't remember name or last time he took BP medication. Additionally, PA noted mental status as alert but "disoriented." Pt restarted on lisinopril 10 mg daily.  Today, patient reports ***  Labs wnl as of 01/06/21  Compliance? Took meds this morning? When do you take your meds? Dizziness, headaches, blurred vision? History of swelling? Check Clinic BP? Home BP logs? If no logs, bring to next visit w/ BP cuff Go over BP goals Additional BP therapy if needed . Increase lisinopril to 20 mg daily Diet??  Exercise??    Medication adherence *** .  Current BP Medications include: lisinopril 10 mg daily  Antihypertensives tried in the past include: none  Dietary habits include: *** Exercise habits include:*** Family / Social history:  -Fhx: Inflammatory bowel disease in his daughter -Tobacco use: current every day smoker (10 pack year history) -Alcohol use: yes -Illicit drugs: yes - marijuana   O:   Home BP readings: ***  Last 3 Office BP readings: BP Readings from Last 3 Encounters:  01/06/21 106/82  01/04/21 (!) 160/100  12/27/20 (!) 180/101    BMET    Component Value Date/Time   NA 140 01/06/2021 1317   K 3.8 01/06/2021 1317   CL 109 01/06/2021 1317   CO2 23 01/06/2021 1317   GLUCOSE 177 (H) 01/06/2021 1317   BUN 15 01/06/2021 1317   CREATININE 0.93 01/06/2021 1317   CALCIUM 9.1 01/06/2021 1317   GFRNONAA >60 01/06/2021 1317   GFRAA >60 11/07/2019 1240    Renal function: Estimated Creatinine Clearance: 73.5 mL/min (by C-G formula based on SCr of 0.93 mg/dL).  Clinical ASCVD: No  The ASCVD Risk score Mikey Bussing DC Jr., et al., 2013)  failed to calculate for the following reasons:   The valid total cholesterol range is 130 to 320 mg/dL  A/P: Hypertension diagnosed *** currently *** on current medications. BP Goal = < *** mmHg. Medication adherence ***.  -{Meds adjust:18428} ***.  -F/u labs ordered - *** -Counseled on lifestyle modifications for blood pressure control including reduced dietary sodium, increased exercise, adequate sleep.  Results reviewed and written information provided.   Total time in face-to-face counseling *** minutes.   F/U Clinic Visit in ***.  Patient seen with ***  Lorel Monaco, PharmD, BCPS PGY2 Ambulatory Care Resident [place]

## 2021-01-09 NOTE — Telephone Encounter (Signed)
Made no changes to pt's schedule per 1/21 los for open f/u.

## 2021-01-11 ENCOUNTER — Ambulatory Visit: Payer: Medicaid Other | Admitting: Pharmacist

## 2021-01-16 ENCOUNTER — Telehealth: Payer: Self-pay | Admitting: *Deleted

## 2021-01-16 NOTE — Telephone Encounter (Signed)
Received vm call from Abigail CSW/[facility] stating that daughter, Devin Myers called & is transferring care to [facility]  She can be reached at [phone]. Pt is already there with her.  She was told that records need to be sent to fax # [phone].  Message to HIM/Pod 1.

## 2021-01-17 ENCOUNTER — Other Ambulatory Visit: Payer: Self-pay

## 2021-01-17 DIAGNOSIS — C220 Liver cell carcinoma: Secondary | ICD-10-CM

## 2021-01-27 ENCOUNTER — Telehealth: Payer: Self-pay | Admitting: Hematology

## 2021-01-27 NOTE — Telephone Encounter (Signed)
Release: [id number] Faxed medical records to [facility] @ fax # [phone]

## 2021-02-03 ENCOUNTER — Ambulatory Visit: Payer: Medicaid Other | Admitting: Family

## 2021-04-16 DEATH — deceased

## 2021-07-10 IMAGING — CT NM PET TUM IMG INITIAL (PI) SKULL BASE T - THIGH
1 of 7 series · 1 of 25 positions shown · non-contrast
Comparison: CT scans from August 2019 and 12/09/2020

CLINICAL DATA: Initial treatment strategy for hepatocellular
carcinoma.

EXAM:
NUCLEAR MEDICINE PET SKULL BASE TO THIGH
TECHNIQUE: 7.02 mCi F-18 FDG was injected intravenously. Full-ring PET imaging
was performed from the skull base to thigh after the radiotracer. CT
data was obtained and used for attenuation correction and anatomic
localization.
Fasting blood glucose: 125 mg/dl

[Series 4: ct sk_thigh 5.0 bf37 · axial · 5.0mm · 0.98mm/px · 1 of 226 slices shown]
[im 226/226  brain]
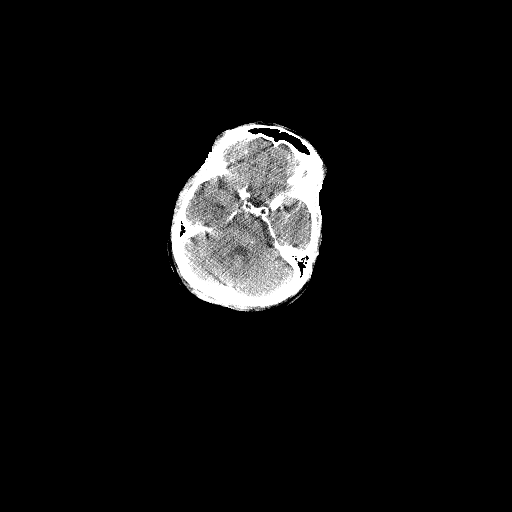

[1 of 25 positions shown; findings below may reference images not displayed]

FINDINGS: Mediastinal blood pool activity: SUV max

Liver activity: SUV max NA

NECK: No hypermetabolic lymph nodes in the neck.

Incidental CT findings: none

CHEST: No hypermetabolic mediastinal or hilar nodes. No suspicious
pulmonary nodules on the CT scan.

Incidental CT findings: Stable vascular calcifications including
three-vessel coronary artery calcifications.

ABDOMEN/PELVIS: Cirrhotic appearing changes involving the liver.
Heterogeneous infiltrating lesion involving most of the left hepatic
lobe is mildly hypermetabolic with SUV max of 5.56.

Small lymph nodes noted along the posterior margin of the left
hepatic lobe. These measure a maximum of 7.5 mm and SUV max is 3.64.

Epicardial lymph node measures 8 mm and is mildly hypermetabolic
with SUV max of 3.71.

Incidental CT findings: Stable vascular calcifications. Small
periumbilical abdominal wall hernia containing fat.

SKELETON: No significant bony findings.  Heel

Incidental CT findings: none
IMPRESSION: 1. Large infiltrating left hepatic lobe lesion is hypermetabolic and
consistent with known hepatocellular cancer.
2. Small adjacent hypermetabolic lymph nodes. No findings for
distant metastatic disease.
3. Mild emphysematous changes and three-vessel coronary artery
calcifications.
